# Patient Record
Sex: Female | Born: 1955 | Race: White | Hispanic: No | Marital: Married | Smoking: Never smoker
Health system: Southern US, Community
[De-identification: ages and names within clinical notes are randomized; demographics above are authoritative.]

## PROBLEM LIST (undated history)

## (undated) DIAGNOSIS — F418 Other specified anxiety disorders: Secondary | ICD-10-CM

## (undated) DIAGNOSIS — I341 Nonrheumatic mitral (valve) prolapse: Secondary | ICD-10-CM

## (undated) DIAGNOSIS — I10 Essential (primary) hypertension: Secondary | ICD-10-CM

## (undated) DIAGNOSIS — I2510 Atherosclerotic heart disease of native coronary artery without angina pectoris: Secondary | ICD-10-CM

## (undated) DIAGNOSIS — I214 Non-ST elevation (NSTEMI) myocardial infarction: Secondary | ICD-10-CM

## (undated) HISTORY — PX: OTHER SURGICAL HISTORY: SHX169

## (undated) HISTORY — DX: Other specified anxiety disorders: F41.8

## (undated) HISTORY — DX: Essential (primary) hypertension: I10

## (undated) HISTORY — DX: Non-ST elevation (NSTEMI) myocardial infarction: I21.4

## (undated) HISTORY — DX: Atherosclerotic heart disease of native coronary artery without angina pectoris: I25.10

## (undated) HISTORY — DX: Nonrheumatic mitral (valve) prolapse: I34.1

---

## 1993-02-25 DIAGNOSIS — I341 Nonrheumatic mitral (valve) prolapse: Secondary | ICD-10-CM

## 1993-02-25 HISTORY — DX: Nonrheumatic mitral (valve) prolapse: I34.1

## 2005-01-29 ENCOUNTER — Ambulatory Visit (HOSPITAL_COMMUNITY): Admission: RE | Admit: 2005-01-29 | Discharge: 2005-01-29 | Payer: Self-pay | Admitting: Family Medicine

## 2005-09-17 ENCOUNTER — Ambulatory Visit (HOSPITAL_COMMUNITY): Admission: RE | Admit: 2005-09-17 | Discharge: 2005-09-17 | Payer: Self-pay | Admitting: Family Medicine

## 2006-03-31 ENCOUNTER — Ambulatory Visit (HOSPITAL_COMMUNITY): Admission: RE | Admit: 2006-03-31 | Discharge: 2006-03-31 | Payer: Self-pay | Admitting: Family Medicine

## 2007-04-20 ENCOUNTER — Ambulatory Visit (HOSPITAL_COMMUNITY): Admission: RE | Admit: 2007-04-20 | Discharge: 2007-04-20 | Payer: Self-pay | Admitting: Family Medicine

## 2007-05-04 ENCOUNTER — Ambulatory Visit (HOSPITAL_COMMUNITY): Admission: RE | Admit: 2007-05-04 | Discharge: 2007-05-04 | Payer: Self-pay | Admitting: Family Medicine

## 2007-09-14 ENCOUNTER — Ambulatory Visit (HOSPITAL_COMMUNITY): Admission: RE | Admit: 2007-09-14 | Discharge: 2007-09-14 | Payer: Self-pay | Admitting: Family Medicine

## 2007-10-23 ENCOUNTER — Emergency Department (HOSPITAL_COMMUNITY): Admission: EM | Admit: 2007-10-23 | Discharge: 2007-10-23 | Payer: Self-pay | Admitting: Emergency Medicine

## 2007-11-09 ENCOUNTER — Ambulatory Visit (HOSPITAL_COMMUNITY): Admission: RE | Admit: 2007-11-09 | Discharge: 2007-11-09 | Payer: Self-pay | Admitting: Family Medicine

## 2008-05-19 ENCOUNTER — Ambulatory Visit (HOSPITAL_COMMUNITY): Admission: RE | Admit: 2008-05-19 | Discharge: 2008-05-19 | Payer: Self-pay | Admitting: Family Medicine

## 2009-04-05 ENCOUNTER — Ambulatory Visit (HOSPITAL_COMMUNITY): Payer: Self-pay | Admitting: Oncology

## 2009-04-05 ENCOUNTER — Encounter (HOSPITAL_COMMUNITY): Admission: RE | Admit: 2009-04-05 | Discharge: 2009-05-05 | Payer: Self-pay | Admitting: Oncology

## 2009-07-05 ENCOUNTER — Ambulatory Visit (HOSPITAL_COMMUNITY): Admission: RE | Admit: 2009-07-05 | Discharge: 2009-07-05 | Payer: Self-pay | Admitting: Family Medicine

## 2009-07-10 ENCOUNTER — Encounter (HOSPITAL_COMMUNITY): Admission: RE | Admit: 2009-07-10 | Discharge: 2009-08-09 | Payer: Self-pay | Admitting: Oncology

## 2009-07-31 ENCOUNTER — Ambulatory Visit (HOSPITAL_COMMUNITY): Payer: Self-pay | Admitting: Oncology

## 2010-03-17 ENCOUNTER — Encounter: Payer: Self-pay | Admitting: Family Medicine

## 2010-03-18 ENCOUNTER — Encounter: Payer: Self-pay | Admitting: Family Medicine

## 2010-04-08 ENCOUNTER — Encounter: Payer: Self-pay | Admitting: Cardiology

## 2010-04-08 ENCOUNTER — Inpatient Hospital Stay (HOSPITAL_COMMUNITY)
Admission: RE | Admit: 2010-04-08 | Discharge: 2010-04-13 | DRG: 282 | Disposition: A | Discharge status: Home / Self Care | Payer: 59 | Source: Other Acute Inpatient Hospital | Attending: Cardiology | Admitting: Cardiology

## 2010-04-08 DIAGNOSIS — F341 Dysthymic disorder: Secondary | ICD-10-CM | POA: Diagnosis present

## 2010-04-08 DIAGNOSIS — I214 Non-ST elevation (NSTEMI) myocardial infarction: Secondary | ICD-10-CM

## 2010-04-08 DIAGNOSIS — I2510 Atherosclerotic heart disease of native coronary artery without angina pectoris: Secondary | ICD-10-CM | POA: Diagnosis present

## 2010-04-08 DIAGNOSIS — I2109 ST elevation (STEMI) myocardial infarction involving other coronary artery of anterior wall: Principal | ICD-10-CM | POA: Diagnosis present

## 2010-04-08 DIAGNOSIS — I200 Unstable angina: Secondary | ICD-10-CM

## 2010-04-08 DIAGNOSIS — E785 Hyperlipidemia, unspecified: Secondary | ICD-10-CM | POA: Diagnosis present

## 2010-04-08 DIAGNOSIS — I10 Essential (primary) hypertension: Secondary | ICD-10-CM | POA: Diagnosis present

## 2010-04-08 DIAGNOSIS — Z79899 Other long term (current) drug therapy: Secondary | ICD-10-CM

## 2010-04-08 DIAGNOSIS — Z7982 Long term (current) use of aspirin: Secondary | ICD-10-CM

## 2010-04-08 LAB — HEPARIN LEVEL (UNFRACTIONATED): Heparin Unfractionated: 0.16 IU/mL — ABNORMAL LOW (ref 0.30–0.70)

## 2010-04-08 LAB — CARDIAC PANEL(CRET KIN+CKTOT+MB+TROPI)
CK, MB: 47 ng/mL (ref 0.3–4.0)
Relative Index: INVALID (ref 0.0–2.5)
Relative Index: 13 — ABNORMAL HIGH (ref 0.0–2.5)
Total CK: 362 U/L — ABNORMAL HIGH (ref 7–177)
Troponin I: 0.57 ng/mL (ref 0.00–0.06)
Troponin I: 3.2 ng/mL (ref 0.00–0.06)

## 2010-04-08 LAB — CBC
Hemoglobin: 11.3 g/dL — ABNORMAL LOW (ref 12.0–15.0)
MCH: 30.5 pg (ref 26.0–34.0)
RBC: 3.71 MIL/uL — ABNORMAL LOW (ref 3.87–5.11)
WBC: 5.7 10*3/uL (ref 4.0–10.5)

## 2010-04-08 LAB — PROTIME-INR
INR: 1.02 (ref 0.00–1.49)
Prothrombin Time: 13.6 seconds (ref 11.6–15.2)

## 2010-04-08 LAB — DIFFERENTIAL
Basophils Relative: 0 % (ref 0–1)
Monocytes Relative: 7 % (ref 3–12)
Neutro Abs: 4.2 10*3/uL (ref 1.7–7.7)
Neutrophils Relative %: 73 % (ref 43–77)

## 2010-04-08 LAB — MRSA PCR SCREENING: MRSA by PCR: NEGATIVE

## 2010-04-08 NOTE — H&P (Signed)
[name], Harrison NO.:  [id number]  Harrison RECORD NO.:  [id number]           PATIENT TYPE:  I  LOCATION:  2903                         FACILITY:  [facility]  PHYSICIAN:  Jonelle Sidle, MD DATE OF BIRTH:  June 09, 1955  DATE OF ADMISSION:  04/08/2010 DATE OF DISCHARGE:                             HISTORY & PHYSICAL   PRIMARY CARE:  [place]rika [place]  in [place], [place].  REFERRING PHYSICIAN:  Dr. Wende Harrison with the [facility] Team.  REASON FOR ADMISSION:  Acute coronary syndrome, non-ST elevation myocardial infarction.  HISTORY OF PRESENT ILLNESS:  [name] is a pleasant 55 year old woman with a history of hypertension, remote borderline diabetes mellitus that reportedly resolved with significant weight loss and diet, and no clear history of hyperlipidemia or cardiovascular disease.  Erika Harrison indicates that Erika Harrison has been in Erika Harrison usual state of health, exercising regularly by walking and using a stationary ski machine without symptoms.  Yesterday evening after taking Erika Harrison medicines and lying down, Erika Harrison began to suddenly experience an intense burning sensation with pressure in Erika [place] upper chest, ultimately radiating to Erika Harrison arms, and associated with nausea after several minutes.  Erika Harrison had never experienced any similar symptoms, became concerned, and went to the [facility] Emergency Department.  Erika Harrison was treated with aspirin, nitroglycerin, and heparin with improvement in symptoms, subsequently admitted for further observation.  Erika [place] initial cardiac markers were normal, however, troponin-I trended up to 1.58, and then down to 0.93. Initial ECG showed sinus rhythm with poor R-wave progression in the septal leads, otherwise nonspecific ST changes, question hyperacute T- waves in leads V3-V6.  Subsequent tracing today demonstrates sinus rhythm with evolutionary changes in the ST-T waves throughout the precordium suggestive  of Wellens T-waves.  Erika Harrison remains hemodynamically and symptomatically stable and was transferred here to the intensive care unit for further management.  ALLERGIES:  No known drug allergies.  MEDICATIONS AT HOME:  Include, 1. Paxil 40 mg p.o. daily. 2. Aspirin 81 mg p.o. daily. 3. Hydrochlorothiazide 25 mg p.o daily. 4. Clonazepam 0.5 mg p.o. b.i.d. 5. Captopril 12.5 mg p.o. b.i.d.  PAST Harrison HISTORY:  Includes hypertension, equivocal history of mitral valve prolapse that was reportedly assessed back in 1995, at which time the patient reports a normal stress test.  Previous surgeries include right breast and laparoscopic abdominal surgery.  Also history of anxiety and depression.  States that Erika Harrison was seen by Dr. Glenford Harrison several months ago related to "low white blood cell count", details not entirely clear.  SOCIAL HISTORY:  The patient is married, lives in [place], [place].  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  Denies any history of tobacco or alcohol use.  No illicit substance use.  FAMILY HISTORY:  Reviewed.  The patient's father is living in his 71s with a history of renal cell carcinoma and also diabetes mellitus. Mother is living in [place] 87s, reportedly healthy.  The patient has 3 siblings, a sister with multiple sclerosis and hypertension, a brother with history of drug addiction and hypertension, and another brother with history of rheumatoid arthritis.  There is no clearly documented history of premature cardiovascular disease noted.  REVIEW OF SYSTEMS:  As detailed above.  No reported bleeding diathesis. No melena or hematochezia.  Does have an intermittent history of palpitations, although no dizziness or syncope.  No reported cardiac dysrhythmias.  Stable appetite.  The patient states that Erika Harrison lost 80 pounds intentionally back in 2003 with subsequent significant improvement in Erika Harrison glucose.  No  orthopnea or PND.  No claudication. Otherwise, reviewed negative.  PHYSICAL EXAMINATION:  VITAL SIGNS:  Heart rate is 68 in sinus rhythm, saturations 100% on room air, blood pressure is 106/70, respirations 18 and nonlabored. GENERAL:  This is a normally nourished-appearing woman, in no acute distress. HEENT:  Conjunctivae and lids are normal.  Oropharynx is clear. NECK:  Supple.  No elevated JVP or loud bruits.  No thyromegaly is noted. LUNGS:  Clear without labored breathing at rest. CARDIAC:  Regular rate and rhythm.  No midsystolic click or S3 gallop. No pericardial rub.  No significant systolic murmur. ABDOMEN:  Soft and nontender.  Bowel sounds present. EXTREMITIES:  No significant pitting edema.  Distal pulses are full. SKIN:  Warm and dry. MUSCULOSKELETAL:  No kyphosis is noted. NEUROPSYCHIATRIC:  The patient is alert and oriented x3.  Affect is appropriate.  LABORATORY DATA:  From [facility]; glucose 96, BUN 23, creatinine 0.8, sodium 138, potassium 3.1, chloride 101, bicarb 30, INR 0.9.  WBC 6.1, hemoglobin 13.1, hematocrit 39.5, platelets 258.  Peak troponin-I of 1.58 with CK-MB relative index of 8.9.  Chest x-ray report from [facility] on April 07, 2010, indicates normal cardiomediastinal silhouette with no evidence of consolidation or effusions.  IMPRESSION: 1. Presentation with non-ST-elevation myocardial infarction, symptom     onset last evening, presently improved on combination of heparin     and nitroglycerin.  The patient was also given a Plavix load of 600     mg at [facility], also continued on aspirin.  Erika Harrison     is now transferred to [facility] for further management     and anticipation of a diagnostic cardiac catheterization with eye     toward revascularization strategies.  Erika Harrison is hemodynamically stable     at present. 2. History of hypertension, blood pressure increased at presentation,      now well controlled. 3. Uncertain lipid status, but reportedly reasonably well controlled     and not on any long-term statin medications. 4. Reported history of "borderline diabetes" back in 2003, managed     with significant weight loss and diet and without formal diagnosis     of diabetes mellitus at this point. 5. History of anxiety and depression, on Paxil and clonazepam as an     outpatient. 6. Reported history of intermittent longstanding palpitations, no     obvious history of dysrhythmia. 7. Hypokalemia, on hydrochlorothiazide as an outpatient, no potassium     supplementation.  PLAN:  The patient is now admitted to the intensive care unit.  We will continue aspirin, Plavix, heparin, nitroglycerin, and initiate very low- dose Lopressor as tolerated as well as Crestor.  Captopril will be continued, although at lowered dose for now.  Hold hydrochlorothiazide and replete potassium.  Obtain additional sets of cardiac markers as well  as a hemoglobin A1c and a fasting lipid profile for the morning.  The patient  is scheduled for a diagnostic cardiac catheterization tomorrow with an eye  toward revascularization options, sooner if Erika Harrison becomes clinically unstable.  Risks and benefits were discussed.  Erika Harrison is in agreement to proceed.     Jonelle Sidle, MD     SGM/MEDQ  D:  04/08/2010  T:  04/08/2010  Job:  [id number]  cc:   Dr. Wende Harrison  Electronically Signed by Nona Dell MD on 04/08/2010 01:40:59 PM

## 2010-04-09 DIAGNOSIS — I219 Acute myocardial infarction, unspecified: Secondary | ICD-10-CM

## 2010-04-09 LAB — CBC
Platelets: 178 10*3/uL (ref 150–400)
RBC: 3.55 MIL/uL — ABNORMAL LOW (ref 3.87–5.11)
RDW: 11.8 % (ref 11.5–15.5)
WBC: 5.8 10*3/uL (ref 4.0–10.5)

## 2010-04-09 LAB — LIPID PANEL
HDL: 56 mg/dL (ref >39)
LDL Cholesterol: 54 mg/dL (ref 0–99)
Triglycerides: 52 mg/dL (ref <150)

## 2010-04-09 LAB — GLUCOSE, CAPILLARY
Glucose-Capillary: 105 mg/dL — ABNORMAL HIGH (ref 70–99)
Glucose-Capillary: 107 mg/dL — ABNORMAL HIGH (ref 70–99)
Glucose-Capillary: 133 mg/dL — ABNORMAL HIGH (ref 70–99)

## 2010-04-09 LAB — PROTIME-INR: Prothrombin Time: 12.7 seconds (ref 11.6–15.2)

## 2010-04-09 LAB — CK TOTAL AND CKMB (NOT AT ARMC): Total CK: 545 U/L — ABNORMAL HIGH (ref 7–177)

## 2010-04-09 LAB — HEPARIN LEVEL (UNFRACTIONATED): Heparin Unfractionated: 0.23 IU/mL — ABNORMAL LOW (ref 0.30–0.70)

## 2010-04-09 LAB — BASIC METABOLIC PANEL
CO2: 29 mEq/L (ref 19–32)
Calcium: 8.9 mg/dL (ref 8.4–10.5)
GFR calc Af Amer: >60 mL/min (ref >60)
GFR calc non Af Amer: >60 mL/min (ref >60)
Potassium: 3.7 mEq/L (ref 3.5–5.1)
Sodium: 141 mEq/L (ref 135–145)

## 2010-04-09 LAB — CARDIAC PANEL(CRET KIN+CKTOT+MB+TROPI): Relative Index: 18.1 — ABNORMAL HIGH (ref 0.0–2.5)

## 2010-04-10 LAB — BASIC METABOLIC PANEL
Calcium: 8.6 mg/dL (ref 8.4–10.5)
GFR calc Af Amer: >60 mL/min (ref >60)
GFR calc non Af Amer: >60 mL/min (ref >60)
Glucose, Bld: 92 mg/dL (ref 70–99)
Sodium: 142 mEq/L (ref 135–145)

## 2010-04-10 LAB — HEPARIN LEVEL (UNFRACTIONATED): Heparin Unfractionated: 0.48 IU/mL (ref 0.30–0.70)

## 2010-04-10 LAB — CBC
MCHC: 33.3 g/dL (ref 30.0–36.0)
Platelets: 149 10*3/uL — ABNORMAL LOW (ref 150–400)
RDW: 12.1 % (ref 11.5–15.5)

## 2010-04-10 LAB — GLUCOSE, CAPILLARY: Glucose-Capillary: 101 mg/dL — ABNORMAL HIGH (ref 70–99)

## 2010-04-11 DIAGNOSIS — I2109 ST elevation (STEMI) myocardial infarction involving other coronary artery of anterior wall: Secondary | ICD-10-CM

## 2010-04-11 LAB — CBC
MCH: 30.6 pg (ref 26.0–34.0)
MCV: 91.6 fL (ref 78.0–100.0)
Platelets: 163 10*3/uL (ref 150–400)
RBC: 3.59 MIL/uL — ABNORMAL LOW (ref 3.87–5.11)

## 2010-04-11 NOTE — Procedures (Signed)
Erika Harrison, Erika Harrison           ACCOUNT NO.:  [id number]  MEDICAL RECORD NO.:  [id number]           PATIENT TYPE:  I  LOCATION:  2903                         FACILITY:  [facility]  PHYSICIAN:  Peter M. [place], M.D.  DATE OF BIRTH:  07/25/1955  DATE OF PROCEDURE:  04/08/2010 DATE OF DISCHARGE:                           CARDIAC CATHETERIZATION   INDICATIONS FOR PROCEDURE:  A 55 year old white female who presented with a non-ST-elevation myocardial infarction.  She has a history of hypertension.  After arrival to the hospital, the patient had episode of severe chest pain associated with ST elevation in the anterior septal leads.  Emergent cardiac catheterization was recommended.  At arrival in the cath lab, she was painfree and her ST elevation had resolved.  PROCEDURES: 1. Left heart catheterization. 2. Coronary and left ventricular angiography.  ACCESS:  Via the right femoral artery using standard Seldinger technique.  EQUIPMENTS:  6-French 4-cm right and left Judkins catheter, 6-French pigtail catheter, 6-French arterial sheath.  MEDICATIONS:  Local anesthesia with 1% Xylocaine, Versed 2 mg IV, nitroglycerin 200 mcg intracoronary x1.  CONTRAST:  90 mL of Omnipaque.  HEMODYNAMIC DATA:  Aortic pressure is 109/68 with a mean of 87 mmHg. Left ventricular pressure was 112 with EDP of 11 mmHg.  ANGIOGRAPHIC DATA:  The left coronary artery arises and distributes normally.  The left main coronary artery is normal.  The left anterior descending artery extends out to the apex.  The proximal segment is large and without significant disease.  After takeoff of two small diagonal branches, the LAD tapers significantly throughout the mid-to-distal vessel.  In the distal vessel prior to the apex, there is subtotaled occlusion of the vessel.  At this point, the vessel diameter appears to be less than 1.5 mm.  Left circumflex coronary artery is normal.  The right coronary artery is a  dominant vessel.  It is normal throughout.  No collaterals are seen to the LAD.  Left ventricular angiography performed in the RAO view demonstrates normal left ventricular chamber size.  There is apical akinesia. Overall ejection fraction is estimated at 50-55%.  At the end of the procedure, the patient's groin was closed with an Angio-Seal device with excellent hemostasis.  FINAL INTERPRETATION: 1. Single-vessel obstructive atherosclerotic coronary artery disease     involving the distal LAD.  There is an ulcerated plaque in the mid     LAD that is nonobstructive.  This is probably the cause of her     acute symptoms today.  She is currently painfree and ECG is back to     baseline. 2. Mild left ventricular dysfunction.  PLAN:  Would recommend aggressive medical therapy.  The distal LAD occlusion appears to be too small to warrant intervention with balloon angioplasty.  It is clearly too small to stent.  The ulcerated plaque in the mid vessel is nonobstructive at this time and would recommend aggressive medical therapy.  If she has refractory symptoms with recurrent ST elevation, she would be a candidate for stenting of the mid LAD.  To mend the portion of the LAD, I will report with the addition saying that there does appear  to be an ulcerated plaque in the mid-LAD with some hang-up of contrast.  This is very focal and is clearly nonobstructive at this time.          ______________________________ Peter M. [place], M.D.     PMJ/MEDQ  D:  04/08/2010  T:  04/09/2010  Job:  [id number]  cc:   Fayrene Fearing [facility] Jonelle Sidle, MD  Electronically Signed by PETER [place]and M.D. on 04/11/2010 11:20:45 AM

## 2010-04-12 LAB — CBC
HCT: 33.1 % — ABNORMAL LOW (ref 36.0–46.0)
Hemoglobin: 11.4 g/dL — ABNORMAL LOW (ref 12.0–15.0)
MCH: 30.8 pg (ref 26.0–34.0)
MCHC: 34.4 g/dL (ref 30.0–36.0)

## 2010-04-13 LAB — BASIC METABOLIC PANEL
BUN: 15 mg/dL (ref 6–23)
Calcium: 9.1 mg/dL (ref 8.4–10.5)
Creatinine, Ser: 0.69 mg/dL (ref 0.4–1.2)
GFR calc non Af Amer: >60 mL/min (ref >60)

## 2010-04-13 LAB — CBC
MCH: 30.9 pg (ref 26.0–34.0)
MCV: 89.5 fL (ref 78.0–100.0)
Platelets: 182 10*3/uL (ref 150–400)
RDW: 11.9 % (ref 11.5–15.5)
WBC: 4.2 10*3/uL (ref 4.0–10.5)

## 2010-04-23 ENCOUNTER — Emergency Department (HOSPITAL_COMMUNITY)
Admission: EM | Admit: 2010-04-23 | Discharge: 2010-04-23 | Disposition: A | Discharge status: Home / Self Care | Payer: Self-pay | Attending: Emergency Medicine | Admitting: Emergency Medicine

## 2010-04-23 DIAGNOSIS — R1031 Right lower quadrant pain: Secondary | ICD-10-CM | POA: Insufficient documentation

## 2010-04-23 DIAGNOSIS — Z9889 Other specified postprocedural states: Secondary | ICD-10-CM | POA: Insufficient documentation

## 2010-04-23 DIAGNOSIS — I10 Essential (primary) hypertension: Secondary | ICD-10-CM | POA: Insufficient documentation

## 2010-04-23 DIAGNOSIS — Z79899 Other long term (current) drug therapy: Secondary | ICD-10-CM | POA: Insufficient documentation

## 2010-04-23 DIAGNOSIS — R42 Dizziness and giddiness: Secondary | ICD-10-CM | POA: Insufficient documentation

## 2010-04-23 DIAGNOSIS — E7800 Pure hypercholesterolemia, unspecified: Secondary | ICD-10-CM | POA: Insufficient documentation

## 2010-04-23 LAB — BASIC METABOLIC PANEL
BUN: 20 mg/dL (ref 6–23)
Chloride: 105 mEq/L (ref 96–112)
Creatinine, Ser: 0.72 mg/dL (ref 0.4–1.2)
GFR calc non Af Amer: >60 mL/min (ref >60)
Glucose, Bld: 98 mg/dL (ref 70–99)

## 2010-04-23 LAB — CBC
MCH: 31.3 pg (ref 26.0–34.0)
MCV: 89.9 fL (ref 78.0–100.0)
Platelets: 236 10*3/uL (ref 150–400)
RDW: 11.9 % (ref 11.5–15.5)

## 2010-04-23 LAB — POCT CARDIAC MARKERS

## 2010-04-23 LAB — DIFFERENTIAL
Eosinophils Absolute: 0.1 10*3/uL (ref 0.0–0.7)
Eosinophils Relative: 2 % (ref 0–5)
Lymphs Abs: 1.2 10*3/uL (ref 0.7–4.0)
Monocytes Relative: 13 % — ABNORMAL HIGH (ref 3–12)

## 2010-04-24 NOTE — Cardiovascular Report (Signed)
Summary: [facility]: Cardiac Cath  [facility]: Cardiac Cath   Imported By: Earl Many 04/19/2010 13:42:25  _____________________________________________________________________  External Attachment:    Type:   Image     Comment:   External Document

## 2010-04-25 NOTE — Procedures (Signed)
  [name], Harrison NO.:  [id number]  MEDICAL RECORD NO.:  [id number]           PATIENT TYPE:  I  LOCATION:  4735                         FACILITY:  [facility]  PHYSICIAN:  Tzivia Oneil M. [place], M.D.  DATE OF BIRTH:  February 01, 1956  DATE OF PROCEDURE:  04/12/2010 DATE OF DISCHARGE:                           CARDIAC CATHETERIZATION   INDICATIONS FOR PROCEDURE:  The patient is a 55 year old white female, who presented on April 08, 2010 with acute ST elevation anterior myocardial infarction.  She was found at that time to have an ulcerated plaque in the midvessel and occlusion of the distal LAD.  There were some concern for internal  hematoma.  The patient was brought back to the lab to evaluate the healing of the vessel and whether this hematoma had extended.  PROCEDURE:  Coronary angiography.  ACCESS:  Via the right femoral artery using standard Seldinger technique.  EQUIPMENTS:  5-French 4-cm right and left Judkins catheter, 5-French arterial sheath.  CONTRAST:  50 mL of Omnipaque.  MEDICATIONS:  Local anesthesia, 1% Xylocaine, Versed 2 mg IV.  HEMODYNAMIC DATA:  Aortic pressure is 122/74 with a mean of 94 mmHg.  Coronary angiography demonstrates left main coronary artery is normal.  The left anterior descending artery has mild diffuse narrowing of the entire distal LAD, but there is excellent TIMI grade 3 flow throughout including the prior occlusion in the distal LAD.  There was no evidence of ulceration or dye hang-up in the mid vessel that was noted previously.  The left circumflex coronary artery is normal.  The right coronary artery is normal.  FINAL INTERPRETATION:  Nonobstructive coronary artery disease.  The previous ulceration in the mid-LAD has healed.  There is reperfusion of the distal LAD with excellent TIMI grade 3 flow.  PLAN:  Recommend continued medical therapy.          ______________________________ Erika Harrison M. [place],  M.D.     PMJ/MEDQ  D:  04/12/2010  T:  04/12/2010  Job:  [id number]  cc:   [facility] Jonelle Sidle, MD  Electronically Signed by Tashaun Obey [place] M.D. on 04/25/2010 04:50:31 PM

## 2010-04-25 NOTE — Discharge Summary (Signed)
NAMEGEORGEANA, Erika Harrison           ACCOUNT NO.:  [id number]  MEDICAL RECORD NO.:  [id number]           PATIENT TYPE:  I  LOCATION:  4735                         FACILITY:  [facility]  PHYSICIAN:  Elka Satterfield M. [place], M.D.  DATE OF BIRTH:  05-27-1955  DATE OF ADMISSION:  04/08/2010 DATE OF DISCHARGE:  04/13/2010                              DISCHARGE SUMMARY   PRIMARY CARDIOLOGIST:  (New) Dr. Simona Huh.  INTERVENTIONAL CARDIOLOGIST:  Dr. Aaban Griep [place].  PRIMARY CARE PHYSICIAN:  [facility], [place], [place].  DISCHARGE DIAGNOSES: 1. Non-ST-elevation myocardial infarction.     a.     Cardiac catheterization, April 09, 2010:  Single vessel      obstructive atherosclerotic coronary artery disease involving the      distal left anterior descending artery.  Ulcerated plaque in the      mid LAD, nonobstructive - probable cause of her acute symptoms      today.  Currently pain-free with ECG back to baseline.  Mild LV      dysfunction (apical akinesis, but LVEF normal at 50%-55%).      Medical management pursued.     b.     2-D echocardiogram, April 09, 2010:  LV cavity size      normal, wall thickness normal, LVEF mildly reduced 45%-50%,      akinesis of the apical myocardium, grade 1 diastolic dysfunction.     c.     Relook cardiac catheterization, April 12, 2010:      Nonobstructive coronary artery disease.  A previous ulceration in      the mid-LAD has healed.  There is a reperfusion of the distal LAD      with excellent TIMI grade 3 flow.  SECONDARY DIAGNOSES: 1. Hypertension. 2. Depression/anxiety.  PAST SURGICAL HISTORY: 1. Status post laparoscopic abdominal surgery. 2. Surgery to right breast.  ALLERGIES:  NKDA.  PROCEDURES: 1. EKG, April 08, 2010:  NSR, poor R-wave progression in the septal     leads, nonspecific ST changes, question of hyperacute T-wave in V3-     V6. 2. EKG, April 08, 2010.  NSR, evolving changes in the ST-T wave  throughout the precordium suggesting Wellens syndrome. 3. Cardiac catheterization, April 09, 2010:  LM normal.  LAD,     proximal segment is large without significant disease, after     takeoff of two small diagonal branches LAD tapers significantly     throughout the mid-to-distal vessel, prior to apex there is     subtotal occlusion of the vessel, diameter appears less than 1.5     mm.  Left circumflex normal.  Right coronary, dominant, normal.  No     collateral seen to LAD.  Apical akinesis with LVEF of 50%-55%. 4. 2-D echocardiogram, April 09, 2010:  Please see discharge     diagnoses section #1, subsection b. 5. Relook cardiac catheterization, April 12, 2010:  Please see     discharge diagnoses section #1, subsection c.  HISTORY OF PRESENT ILLNESS:  [name] is a 55 year old Caucasian female, who presented with a non-ST-elevation myocardial infarction noted after a severe chest discomfort at [facility].  She was evaluated by attending cardiologist, Dr. Simona Huh, and due to concerning EKG changes was sent emergently to cardiac cath lab at [facility].  The patient was loaded with 600 mg of Plavix prior to transfer to [facility] cath lab.  HOSPITAL COURSE:  The patient was admitted and underwent procedures as described above.  Of note, there was no amenable sites for intervention on her initial cardiac catheterization and so initial plan was for aggressive medical management.  A 2-D echocardiogram was completed the following day that showed mild LV dysfunction and her meds were adjusted accordingly (see discharge medications).  The patient worked with Cardiac Rehab on April 09, 2010, receiving education on that day only.  Films were reviewed from initial catheterization by attending cardiologist/interventionalist Dr. Shawnie Pons and discussed with attending cardiologist Dr. Marca Ancona.  The patient was thought to possibly had coronary dissection and  therefore scheduled for relook cardiac cath on April 12, 2010.  Luckily, that showed healing of her previous ulcerated plaque in LAD and no other significant disease with reperfusion of the entire LAD.  She was kept overnight and seen in the morning by attending cardiologist/interventionalist Dr. Lutricia Widjaja [place]. She had no complications and was deemed stable for discharge with followup and meds as outlined in their respective sections.  The patient did receive her new medication list, prescriptions, follow-up instructions, and post-cath instructions as well as having all questions and concerns addressed prior to leaving the hospital.  Please note, secondary to financial constraints, the patient received a case management consult for assistant with her medications.  DISCHARGE LABS:  WBC is 4.2, HGB 12.1, HCT 35.1, PLT count is 182. Sodium 140, potassium 3.9, chloride 106, bicarb 29, BUN is 15, creatinine 0.69, glucose 94.  Hemoglobin A1c 5.3%.  Initial full set cardiac enzymes, CK 89, MB 7.5, troponin 0.57.  Second full set, CK 362, MB 47.0, troponin 3.20.  Third full set, CK 654, MB 118.3, troponin 7.06.  Fourth full set, CK 545, MB 93.7, troponin-I 6.22.  Total cholesterol 120, triglycerides 52, HDL 56, LDL 54, total cholesterol/HDL ratio 2.1.  FOLLOWUP PLANS AND APPOINTMENTS:  Dr. Simona Huh at [facility], [place] office, April 26, 2010 at 1:15 p.m.  DISCHARGE MEDICATIONS: 1. Enteric-coated aspirin 81 mg p.o. daily. 2. Carvedilol 3.125 mg p.o. b.i.d. with meals. 3. Clopidogrel 75 mg 1 tablet daily with meals. 4. Nitroglycerin 0.4 mg sublingual q.5 minutes up to 3 doses p.r.n.     for chest discomfort. 5. Pravastatin 40 mg 1 tablet p.o. nightly. 6. Calcium over-the-counter 1 tablet daily. 7. Captopril 25 mg 1 tablet p.o. b.i.d. 8. Clonazepam 1 mg p.o. b.i.d. 9. Fiber over-the-counter 1 tablet p.o. b.i.d. 10.Paxil 40 mg 1 tablet p.o. nightly.  DURATION OF DISCHARGE  ENCOUNTER:  Including physician time was 40 minutes.     Jarrett Ables, PAC   ______________________________ [name], M.D.    MS/MEDQ  D:  04/13/2010  T:  04/13/2010  Job:  [id number]  cc:   Jonelle Sidle, MD [facility]  Electronically Signed by Jarrett Ables [facility] on 04/17/2010 01:03:22 PM Electronically Signed by Lutie Pickler [place] M.D. on 04/25/2010 04:50:28 PM

## 2010-04-26 ENCOUNTER — Encounter (INDEPENDENT_AMBULATORY_CARE_PROVIDER_SITE_OTHER): Payer: Self-pay | Admitting: *Deleted

## 2010-04-26 ENCOUNTER — Ambulatory Visit (INDEPENDENT_AMBULATORY_CARE_PROVIDER_SITE_OTHER): Payer: Self-pay | Admitting: Adult Health

## 2010-04-26 ENCOUNTER — Encounter: Payer: Self-pay | Admitting: Adult Health

## 2010-04-26 DIAGNOSIS — E782 Mixed hyperlipidemia: Secondary | ICD-10-CM | POA: Insufficient documentation

## 2010-04-26 DIAGNOSIS — I10 Essential (primary) hypertension: Secondary | ICD-10-CM

## 2010-04-26 DIAGNOSIS — I2510 Atherosclerotic heart disease of native coronary artery without angina pectoris: Secondary | ICD-10-CM

## 2010-04-27 ENCOUNTER — Encounter: Payer: Self-pay | Admitting: Adult Health

## 2010-04-30 ENCOUNTER — Encounter: Payer: Self-pay | Admitting: Adult Health

## 2010-04-30 LAB — CONVERTED CEMR LAB
AST: 19 units/L (ref 0–37)
Albumin: 4.7 g/dL (ref 3.5–5.2)
Alkaline Phosphatase: 61 units/L (ref 39–117)
HDL: 54 mg/dL (ref >39)
LDL Cholesterol: 66 mg/dL (ref 0–99)
Total Bilirubin: 1.3 mg/dL — ABNORMAL HIGH (ref 0.3–1.2)
Total CHOL/HDL Ratio: 2.5
Total Protein: 6.9 g/dL (ref 6.0–8.3)
Triglycerides: 67 mg/dL (ref <150)

## 2010-05-02 ENCOUNTER — Telehealth (INDEPENDENT_AMBULATORY_CARE_PROVIDER_SITE_OTHER): Payer: Self-pay | Admitting: *Deleted

## 2010-05-03 NOTE — Assessment & Plan Note (Signed)
Summary: POST HOSP [place]   Visit Type:  Follow-up Hospitalization Primary Provider:  [facility]   History of Present Illness: Erika Harrison is a pleasant yet anxious  55 y/o CF we are seeing on hospital follow-up after admission to [facility] hospital with NSTEMI in the setting of ulcerated plaque in the mid LAD, nonobstructive.  Catherization was completed by Dr. Sw[place]and on 04/09/2010.  She actually had a relook catherization on 04/12/2010 with complete healing of the LAD ulceration and to r/o coronary dissection.  There was not found to have any other significant disease elsewhere. She had mild apical hypokinesis with LVEF of 50-55%.  She was placed on plavix (drug assistance in place) statin, ASA, and carvedilol.  She has tolerated the medication and has been without complaint of recurring pain.  She has ntg tablets in a heart shaped locket she wears around her neck. She has multiple questions.   Current Medications (verified): 1)  Paxil 40 Mg Tabs (Paroxetine Hcl) .... Take 1 Tab Daily 2)  Aspir-Low 81 Mg Tbec (Aspirin) .... Take 1 Tab Daily 3)  Clonazepam 1 Mg Tabs (Clonazepam) .... 1/2 -1 Tablet Daily 4)  Carvedilol 3.125 Mg Tabs (Carvedilol) .... Take One Tablet By Mouth Twice A Day 5)  Plavix 75 Mg Tabs (Clopidogrel Bisulfate) .... Take 1 Tablet By Mouth Once A Day 6)  Pravastatin Sodium 40 Mg Tabs (Pravastatin Sodium) .... Take One Tablet By Mouth Daily At Bedtime 7)  Calcarb 600 1500 Mg Tabs (Calcium Carbonate) .... Take 1 Tablet By Mouth Once A Day 8)  Fiber Laxative 625 Mg Tabs (Calcium Polycarbophil) .... Take 1 Tablet By Mouth Once A Day  Allergies (verified): No Known Drug Allergies  Past History:  Past medical, surgical, family and social histories (including risk factors) reviewed, and no changes noted (except as noted below).  Past Medical History: Reviewed history from 04/25/2010 and no changes required. hypertension depression/anxiety non-st-elevation  myocardial infarction nonobstructive coronary artery disease mitral valve prolapse 1995  Past Surgical History: Reviewed history from 04/25/2010 and no changes required. post laparscopic abdominal surgery right breast surgery  Family History: Reviewed history from 04/25/2010 and no changes required. Father:alive hx of renal cell carcinoma Mother:alive and well Siblings:2 brothers 1 rheumatoid arthritis,1 addicted to drugs 1 sister hypertension  Social History: Reviewed history from 04/25/2010 and no changes required. Married  Tobacco Use - No.  Alcohol Use - no Regular Exercise - no Drug Use - no  Review of Systems       All other systems have been reviewed and are negative unless stated above.   Vital Signs:  Patient profile:   55 year old female Height:      63 inches Weight:      135 pounds BMI:     24.00 O2 Sat:      98 % on Room air Pulse rate:   68 / minute BP sitting:   114 / 75  (left arm)  Vitals Entered By: Dreama Saa, CNA (April 26, 2010 1:13 PM)  O2 Flow:  Room air  Physical Exam  General:  Well developed, well nourished, in no acute distress.healthy appearing.   Lungs:  Clear bilaterally to auscultation and percussion. Heart:  Non-displaced PMI, chest non-tender; regular rate and rhythm, S1, S2 without murmurs, rubs or gallops. Carotid upstroke normal, no bruit. Normal abdominal aortic size, no bruits. Femorals normal pulses, no bruits. Pedals normal pulses. No edema, no varicosities. Abdomen:  Bowel sounds positive; abdomen soft and non-tender without  masses, organomegaly, or hernias noted. No hepatosplenomegaly. Msk:  Back normal, normal gait. Muscle strength and tone normal. Pulses:  pulses normal in all 4 extremities Extremities:  Right groin intact without bleeding or hematoma Neurologic:  Alert and oriented x 3. Skin:  Intact without lesions or rashes. Psych:  Normal affect.   Impression & Recommendations:  Problem # 1:  CAD  (ICD-414.00) Erika Harrison is stable at present. She has been without complaints of chest pain. She is overly aware of any discomfort that may occur.  She has ntg with her at all times.  All questions were answered,diagrams were used, and she verbalized understanding.  She will continue current medications as directed.  We will see her in 3 months for follow-up. Lipids and LFT's in 6 weeks. Her updated medication list for this problem includes:    Aspir-low 81 Mg Tbec (Aspirin) .Marland Kitchen... Take 1 tab daily    Carvedilol 3.125 Mg Tabs (Carvedilol) .Marland Kitchen... Take one tablet by mouth twice a day    Plavix 75 Mg Tabs (Clopidogrel bisulfate) .Marland Kitchen... Take 1 tablet by mouth once a day  Problem # 2:  HYPERCHOLESTEROLEMIA (ICD-272.0) She will be followed with ongoing labs as above. Her updated medication list for this problem includes:    Pravastatin Sodium 40 Mg Tabs (Pravastatin sodium) .Marland Kitchen... Take one tablet by mouth daily at bedtime  Other Orders: Future Orders: T-Lipid Profile ([id number]) ... 04/30/2010 T-Hepatic Function [phone]) ... 04/30/2010  Patient Instructions: 1)  Your physician recommends that you schedule a follow-up appointment in: 6 months 2)  Your physician recommends that you return for lab work GN:FAOZ week

## 2010-05-03 NOTE — Letter (Signed)
Summary: [place] [facility], [facility] at [employer]  [address] [place] [address]   Phone: [phone]  Fax: [phone]     April 26, 2010 MRN: [id number]   NELLA BOTSFORD [address] [place], [place]  [address]      YOUR LAB WORK IS DUE   MONDAY   April 30, 2010  Please go to [facility], located across the street from [facility] on the second floor.  Hours are Monday - Friday 7am until 7:30pm         Saturday 8am until 12noon    _X_  DO NOT EAT OR DRINK AFTER MIDNIGHT EVENING PRIOR TO LABWORK

## 2010-05-08 NOTE — Progress Notes (Signed)
  Phone Note Outgoing Call   Call placed by: Scherrie Bateman, LPN,  May 02, 2010 6:17 PM Call placed to: Patient Summary of Call: pt aware plavix 75 mg  received  from [facility]. will leave at front desk to pick up. Initial call taken by: Scherrie Bateman, LPN,  May 02, 2010 6:17 PM

## 2010-05-14 LAB — CBC
HCT: 37.4 % (ref 36.0–46.0)
Platelets: 224 10*3/uL (ref 150–400)
RDW: 12.3 % (ref 11.5–15.5)
WBC: 4.9 10*3/uL (ref 4.0–10.5)

## 2010-05-14 LAB — DIFFERENTIAL
Basophils Absolute: 0 10*3/uL (ref 0.0–0.1)
Eosinophils Relative: 1 % (ref 0–5)
Lymphocytes Relative: 30 % (ref 12–46)
Lymphs Abs: 1.5 10*3/uL (ref 0.7–4.0)
Neutro Abs: 3.1 10*3/uL (ref 1.7–7.7)

## 2010-05-16 LAB — CBC
HCT: 37 % (ref 36.0–46.0)
Hemoglobin: 12.7 g/dL (ref 12.0–15.0)
MCV: 93.5 fL (ref 78.0–100.0)
RBC: 3.96 MIL/uL (ref 3.87–5.11)
WBC: 5.2 10*3/uL (ref 4.0–10.5)

## 2010-05-16 LAB — DIFFERENTIAL
Eosinophils Absolute: 0.1 10*3/uL (ref 0.0–0.7)
Eosinophils Relative: 1 % (ref 0–5)
Lymphocytes Relative: 33 % (ref 12–46)
Lymphs Abs: 1.7 10*3/uL (ref 0.7–4.0)
Monocytes Absolute: 0.4 10*3/uL (ref 0.1–1.0)
Monocytes Relative: 8 % (ref 3–12)

## 2010-05-25 ENCOUNTER — Telehealth: Payer: Self-pay | Admitting: Cardiology

## 2010-05-25 NOTE — Telephone Encounter (Signed)
PT RIGHT LEG KEEP SWELLING SHE STATES SHE HAD HIT IT ON SOMETHING A FEW DAYS AGO AND IT KEEP SWELLING UP.

## 2010-07-05 ENCOUNTER — Other Ambulatory Visit: Payer: Self-pay | Admitting: *Deleted

## 2010-07-05 MED ORDER — CARVEDILOL 3.125 MG PO TABS: 3.1250 mg | ORAL_TABLET | Freq: Two times a day (BID) | ORAL | Status: DC

## 2010-07-05 MED ORDER — PRAVASTATIN SODIUM 40 MG PO TABS: 40.0000 mg | ORAL_TABLET | Freq: Every evening | ORAL | Status: DC

## 2010-07-17 ENCOUNTER — Other Ambulatory Visit (HOSPITAL_COMMUNITY): Payer: Self-pay | Admitting: Family Medicine

## 2010-07-17 DIAGNOSIS — Z139 Encounter for screening, unspecified: Secondary | ICD-10-CM

## 2010-07-19 ENCOUNTER — Other Ambulatory Visit: Payer: Self-pay | Admitting: *Deleted

## 2010-07-19 MED ORDER — CAPTOPRIL 25 MG PO TABS: 25.0000 mg | ORAL_TABLET | Freq: Two times a day (BID) | ORAL | Status: DC

## 2010-07-26 ENCOUNTER — Ambulatory Visit (HOSPITAL_COMMUNITY)
Admission: RE | Admit: 2010-07-26 | Discharge: 2010-07-26 | Disposition: A | Discharge status: Home / Self Care | Payer: Self-pay | Source: Ambulatory Visit | Attending: Family Medicine | Admitting: Family Medicine

## 2010-07-26 DIAGNOSIS — Z139 Encounter for screening, unspecified: Secondary | ICD-10-CM

## 2010-08-01 ENCOUNTER — Telehealth: Payer: Self-pay | Admitting: Cardiology

## 2010-08-01 NOTE — Telephone Encounter (Signed)
Left message for pt to call Deliah Goody

## 2010-08-01 NOTE — Telephone Encounter (Signed)
She is a patient in [place] but they told her to call here for Plavix thru the drug plan.  She has 10 days of medication left and needs to know what to do now?  She can pick this up at either place once it comes in.

## 2010-08-02 ENCOUNTER — Telehealth: Payer: Self-pay | Admitting: *Deleted

## 2010-08-02 DIAGNOSIS — I2510 Atherosclerotic heart disease of native coronary artery without angina pectoris: Secondary | ICD-10-CM

## 2010-08-02 MED ORDER — CLOPIDOGREL BISULFATE 75 MG PO TABS: 75.0000 mg | ORAL_TABLET | Freq: Every day | ORAL | Status: DC

## 2010-08-02 NOTE — Telephone Encounter (Signed)
I do not see any office visits for this patient. H and P from February review. Need to make sure she has a visit scheduled. Plavix is now generic so she may not actually require an assistance program. Has she checked the cost of the generic? Otherwise we could consider a different medication that is available through an assistance program.

## 2010-08-02 NOTE — Telephone Encounter (Signed)
Spoke with [facility] - last refill was sent out 08/01/10 per Thayer Ohm.  Pt aware and we will call her once we receive the med.  Pt also was given the website for [email or web address] for further assistance.    She thanked me for my time and help.

## 2010-08-02 NOTE — Telephone Encounter (Signed)
Spoke with pt who states [facility] sent her a letter stating they have one more 90 day supply for her.  I will call them to verify and if so have them sent it out.  Also discussed with pt the Rxrelief website that may offer her assistance with medication.  She requested a refill be sent to [facility] in [place]

## 2010-08-02 NOTE — Telephone Encounter (Signed)
There is no longer a drug assistance program for Plavix due to it being generic now.  We do not have samples either.  Need to ask Dr Diona Browner if medication can be changed to another that has a pt assistance program or samples.

## 2010-10-11 ENCOUNTER — Encounter: Payer: Self-pay | Admitting: Adult Health

## 2010-10-11 ENCOUNTER — Encounter: Payer: Self-pay | Admitting: *Deleted

## 2010-10-11 ENCOUNTER — Ambulatory Visit (INDEPENDENT_AMBULATORY_CARE_PROVIDER_SITE_OTHER): Payer: Self-pay | Admitting: Adult Health

## 2010-10-11 DIAGNOSIS — I2510 Atherosclerotic heart disease of native coronary artery without angina pectoris: Secondary | ICD-10-CM

## 2010-10-11 DIAGNOSIS — E7800 Pure hypercholesterolemia, unspecified: Secondary | ICD-10-CM

## 2010-10-11 NOTE — Progress Notes (Signed)
HPI: Erika Harrison is a 55 y/o patient of Dr. Dietrich Pates we are seeing on 6 months follow-up for known history of NSTEMI in the setting of ulcerated plaque in the mid LAD, nonobstructive CAD per cath 2./16/2012. This demonstrated complete healing of the LAD ulceration. She comes today very anxious about her status. She denies symptoms with the exception of some left arm tenderness at times, that is relieved with tylenol. She is afraid to do anything exertional for fear that she will have an MI.  She brings with her questions about her activities and medications.  No Known Allergies  Current Outpatient Prescriptions  Medication Sig Dispense Refill  . aspirin 81 MG EC tablet Take 81 mg by mouth daily.        . Calcium Carbonate (CALTRATE 600) 1500 MG TABS Take 1 tablet by mouth daily.        . captopril (CAPOTEN) 25 MG tablet Take 1 tablet (25 mg total) by mouth 2 (two) times daily.  60 tablet  3  . carvedilol (COREG) 3.125 MG tablet Take 1 tablet (3.125 mg total) by mouth 2 (two) times daily.  60 tablet  6  . clonazePAM (KLONOPIN) 1 MG tablet Take 0.5-1 mg by mouth daily as needed.        . clopidogrel (PLAVIX) 75 MG tablet Take 1 tablet (75 mg total) by mouth daily.  90 tablet  3  . nitroGLYCERIN (NITROSTAT) 0.4 MG SL tablet Place 0.4 mg under the tongue every 5 (five) minutes as needed.        Marland Kitchen PARoxetine (PAXIL) 40 MG tablet Take 40 mg by mouth every morning.        . polycarbophil (FIBERCON) 625 MG tablet Take 625 mg by mouth daily.        . pravastatin (PRAVACHOL) 40 MG tablet Take 1 tablet (40 mg total) by mouth every evening.  30 tablet  11    Past Medical History  Diagnosis Date  . Hypertension   . Depression with anxiety   . Non-ST elevation MI (NSTEMI)   . Coronary artery disease     nonobstruction  . Mitral valve prolapse 1995    Past Surgical History  Procedure Date  . Post laparscopic abdominal surgery   . Right breast surgery     [id number] of systems complete and  found to be negative unless listed above PHYSICAL EXAM BP 115/78  Pulse 81  Ht 5\' 3"  (1.6 m)  Wt 134 lb (60.782 kg)  BMI 23.74 kg/m2 General: Well developed, well nourished, in no acute distress Head: Eyes PERRLA, No xanthomas.   Normal cephalic and atramatic  Lungs: Clear bilaterally to auscultation and percussion. Heart: HRRR S1 S2,   Pulses are 2+ & equal.            No carotid bruit. No JVD.  No abdominal bruits. No femoral bruits. ANeuro: Alert and oriented X 3. Psych:  Good affect, responds appropriately, mild anxiety    ASSESSMENT AND PLAN

## 2010-10-11 NOTE — Assessment & Plan Note (Signed)
Other than anxiety, she appears stable from cardiac standpoint. BP and HR are stable.  She is given answers to her multiple questions to include increasing her activity, ok to take fish oil, able to lift baskets of clothes and to avoid caffeine.  She is to go to the beach this weekend and I have encouraged her to do this and not to worry. She verbalizes understanding.  We will see her in 6 months.

## 2010-10-11 NOTE — Assessment & Plan Note (Signed)
She is to have follow-up labs every 6 months.. Most recent were completed in March. She is due next month and will have them drawn at that time.

## 2010-10-11 NOTE — Patient Instructions (Signed)
Your physician recommends that you schedule a follow-up appointment in: 6 MONTHS 

## 2010-10-30 ENCOUNTER — Other Ambulatory Visit: Payer: Self-pay | Admitting: Adult Health

## 2010-10-31 LAB — LIPID PANEL: HDL: 58 mg/dL (ref >39)

## 2011-02-26 ENCOUNTER — Encounter (HOSPITAL_COMMUNITY): Payer: Self-pay

## 2011-02-26 ENCOUNTER — Other Ambulatory Visit: Payer: Self-pay

## 2011-02-26 ENCOUNTER — Emergency Department (HOSPITAL_COMMUNITY): Payer: Self-pay

## 2011-02-26 ENCOUNTER — Observation Stay (HOSPITAL_COMMUNITY)
Admission: EM | Admit: 2011-02-26 | Discharge: 2011-02-27 | Disposition: A | Discharge status: Home / Self Care | Payer: Self-pay | Attending: Internal Medicine | Admitting: Internal Medicine

## 2011-02-26 DIAGNOSIS — R079 Chest pain, unspecified: Secondary | ICD-10-CM | POA: Diagnosis present

## 2011-02-26 DIAGNOSIS — I252 Old myocardial infarction: Secondary | ICD-10-CM | POA: Insufficient documentation

## 2011-02-26 DIAGNOSIS — E7800 Pure hypercholesterolemia, unspecified: Secondary | ICD-10-CM | POA: Insufficient documentation

## 2011-02-26 DIAGNOSIS — F418 Other specified anxiety disorders: Secondary | ICD-10-CM | POA: Diagnosis present

## 2011-02-26 DIAGNOSIS — I10 Essential (primary) hypertension: Secondary | ICD-10-CM | POA: Insufficient documentation

## 2011-02-26 DIAGNOSIS — R0789 Other chest pain: Principal | ICD-10-CM | POA: Insufficient documentation

## 2011-02-26 DIAGNOSIS — F341 Dysthymic disorder: Secondary | ICD-10-CM | POA: Insufficient documentation

## 2011-02-26 DIAGNOSIS — I2510 Atherosclerotic heart disease of native coronary artery without angina pectoris: Secondary | ICD-10-CM | POA: Insufficient documentation

## 2011-02-26 DIAGNOSIS — E782 Mixed hyperlipidemia: Secondary | ICD-10-CM | POA: Diagnosis present

## 2011-02-26 DIAGNOSIS — I214 Non-ST elevation (NSTEMI) myocardial infarction: Secondary | ICD-10-CM

## 2011-02-26 LAB — POCT I-STAT TROPONIN I: Troponin i, poc: 0 ng/mL (ref 0.00–0.08)

## 2011-02-26 NOTE — ED Provider Notes (Signed)
Medical screening examination/treatment/procedure(s) were conducted as a shared visit with non-physician practitioner(s) and myself.  I personally evaluated the patient during the encounter  I was present during the history and physical.   Hanley Seamen, MD 02/26/11 2340

## 2011-02-26 NOTE — ED Notes (Signed)
Onset of left chest pain approx 6 pm tonight, with pain radiating into left breast area, states she took total of 2 nitro at home with relief.  Pt also took total of 324 aspirin.    Pt states pain is 2/10 at this time.

## 2011-02-27 ENCOUNTER — Encounter (HOSPITAL_COMMUNITY): Payer: Self-pay | Admitting: Internal Medicine

## 2011-02-27 DIAGNOSIS — F418 Other specified anxiety disorders: Secondary | ICD-10-CM | POA: Diagnosis present

## 2011-02-27 DIAGNOSIS — I214 Non-ST elevation (NSTEMI) myocardial infarction: Secondary | ICD-10-CM | POA: Insufficient documentation

## 2011-02-27 DIAGNOSIS — R079 Chest pain, unspecified: Secondary | ICD-10-CM | POA: Diagnosis present

## 2011-02-27 DIAGNOSIS — I10 Essential (primary) hypertension: Secondary | ICD-10-CM | POA: Diagnosis present

## 2011-02-27 LAB — CARDIAC PANEL(CRET KIN+CKTOT+MB+TROPI)
CK, MB: 2.1 ng/mL (ref 0.3–4.0)
Relative Index: INVALID (ref 0.0–2.5)
Relative Index: INVALID (ref 0.0–2.5)
Total CK: 58 U/L (ref 7–177)
Total CK: 53 U/L (ref 7–177)
Troponin I: <0.3 ng/mL (ref <0.30)
Troponin I: <0.3 ng/mL (ref <0.30)

## 2011-02-27 LAB — CBC
MCV: 89.6 fL (ref 78.0–100.0)
Platelets: 231 10*3/uL (ref 150–400)
Platelets: 199 10*3/uL (ref 150–400)
RBC: 3.93 MIL/uL (ref 3.87–5.11)
RDW: 11.8 % (ref 11.5–15.5)
RDW: 11.8 % (ref 11.5–15.5)
WBC: 3.8 10*3/uL — ABNORMAL LOW (ref 4.0–10.5)
WBC: 3.3 10*3/uL — ABNORMAL LOW (ref 4.0–10.5)

## 2011-02-27 LAB — BASIC METABOLIC PANEL
CO2: 29 mEq/L (ref 19–32)
Calcium: 10 mg/dL (ref 8.4–10.5)
Calcium: 9.7 mg/dL (ref 8.4–10.5)
Creatinine, Ser: 0.63 mg/dL (ref 0.50–1.10)
GFR calc Af Amer: >90 mL/min (ref >90)
GFR calc non Af Amer: >90 mL/min (ref >90)
GFR calc non Af Amer: >90 mL/min (ref >90)
Sodium: 137 mEq/L (ref 135–145)
Sodium: 136 mEq/L (ref 135–145)

## 2011-02-27 LAB — DIFFERENTIAL
Basophils Absolute: 0 10*3/uL (ref 0.0–0.1)
Eosinophils Absolute: 0.1 10*3/uL (ref 0.0–0.7)
Eosinophils Relative: 2 % (ref 0–5)
Lymphocytes Relative: 46 % (ref 12–46)
Neutrophils Relative %: 42 % — ABNORMAL LOW (ref 43–77)

## 2011-02-27 MED ORDER — CALCIUM POLYCARBOPHIL 625 MG PO TABS
625.0000 mg | ORAL_TABLET | Freq: Every day | ORAL | Status: DC
  Administered 2011-02-27: 625 mg via ORAL
  Filled 2011-02-27 (×3): qty 1

## 2011-02-27 MED ORDER — SODIUM CHLORIDE 0.9 % IJ SOLN
3.0000 mL | Freq: Two times a day (BID) | INTRAMUSCULAR | Status: DC
  Administered 2011-02-27 (×2): 3 mL via INTRAVENOUS
  Filled 2011-02-27 (×2): qty 3

## 2011-02-27 MED ORDER — ENOXAPARIN SODIUM 40 MG/0.4ML ~~LOC~~ SOLN
40.0000 mg | SUBCUTANEOUS | Status: DC
  Administered 2011-02-27: 40 mg via SUBCUTANEOUS
  Filled 2011-02-27: qty 0.4

## 2011-02-27 MED ORDER — ACETAMINOPHEN 325 MG PO TABS: 650.0000 mg | ORAL_TABLET | ORAL | Status: DC | PRN

## 2011-02-27 MED ORDER — ONDANSETRON HCL 4 MG/2ML IJ SOLN: 4.0000 mg | Freq: Four times a day (QID) | INTRAMUSCULAR | Status: DC | PRN

## 2011-02-27 MED ORDER — CLONAZEPAM 0.5 MG PO TABS
0.5000 mg | ORAL_TABLET | Freq: Two times a day (BID) | ORAL | Status: DC | PRN
  Administered 2011-02-27: 0.5 mg via ORAL
  Filled 2011-02-27: qty 1

## 2011-02-27 MED ORDER — PAROXETINE HCL 20 MG PO TABS
40.0000 mg | ORAL_TABLET | ORAL | Status: DC
  Administered 2011-02-27: 40 mg via ORAL
  Filled 2011-02-27: qty 2

## 2011-02-27 MED ORDER — CAPTOPRIL 25 MG PO TABS
25.0000 mg | ORAL_TABLET | Freq: Two times a day (BID) | ORAL | Status: DC
  Administered 2011-02-27: 25 mg via ORAL
  Filled 2011-02-27: qty 1

## 2011-02-27 MED ORDER — CARVEDILOL 3.125 MG PO TABS
3.1250 mg | ORAL_TABLET | Freq: Two times a day (BID) | ORAL | Status: DC
  Administered 2011-02-27: 3.125 mg via ORAL
  Filled 2011-02-27: qty 1

## 2011-02-27 MED ORDER — NITROGLYCERIN 0.4 MG SL SUBL: 0.4000 mg | SUBLINGUAL_TABLET | Freq: Once | SUBLINGUAL | Status: DC

## 2011-02-27 MED ORDER — ASPIRIN EC 81 MG PO TBEC
81.0000 mg | DELAYED_RELEASE_TABLET | Freq: Every day | ORAL | Status: DC
  Administered 2011-02-27: 81 mg via ORAL
  Filled 2011-02-27 (×2): qty 1

## 2011-02-27 MED ORDER — ALUM & MAG HYDROXIDE-SIMETH 200-200-20 MG/5ML PO SUSP: 30.0000 mL | Freq: Four times a day (QID) | ORAL | Status: DC | PRN

## 2011-02-27 MED ORDER — CLONAZEPAM 0.5 MG PO TABS: 0.5000 mg | ORAL_TABLET | Freq: Every day | ORAL | Status: DC

## 2011-02-27 MED ORDER — TRAZODONE 25 MG HALF TABLET
25.0000 mg | ORAL_TABLET | Freq: Every evening | ORAL | Status: DC | PRN
  Filled 2011-02-27: qty 1

## 2011-02-27 MED ORDER — ACETAMINOPHEN 650 MG RE SUPP: 650.0000 mg | Freq: Four times a day (QID) | RECTAL | Status: DC | PRN

## 2011-02-27 MED ORDER — SODIUM CHLORIDE 0.9 % IV SOLN: 250.0000 mL | INTRAVENOUS | Status: DC | PRN

## 2011-02-27 MED ORDER — SIMVASTATIN 20 MG PO TABS: 20.0000 mg | ORAL_TABLET | Freq: Every day | ORAL | Status: DC

## 2011-02-27 MED ORDER — CLOPIDOGREL BISULFATE 75 MG PO TABS
75.0000 mg | ORAL_TABLET | Freq: Every day | ORAL | Status: DC
  Administered 2011-02-27: 75 mg via ORAL
  Filled 2011-02-27: qty 1

## 2011-02-27 MED ORDER — NITROGLYCERIN 2 % TD OINT
0.5000 [in_us] | TOPICAL_OINTMENT | Freq: Four times a day (QID) | TRANSDERMAL | Status: DC
  Administered 2011-02-27: 0.5 [in_us] via TOPICAL
  Filled 2011-02-27: qty 1

## 2011-02-27 MED ORDER — OXYCODONE HCL 5 MG PO TABS: 5.0000 mg | ORAL_TABLET | ORAL | Status: DC | PRN

## 2011-02-27 MED ORDER — SODIUM CHLORIDE 0.9 % IJ SOLN: 3.0000 mL | INTRAMUSCULAR | Status: DC | PRN

## 2011-02-27 MED ORDER — MORPHINE SULFATE 2 MG/ML IJ SOLN: 1.0000 mg | INTRAMUSCULAR | Status: DC | PRN

## 2011-02-27 MED ORDER — ONDANSETRON HCL 4 MG PO TABS: 4.0000 mg | ORAL_TABLET | ORAL | Status: DC | PRN

## 2011-02-27 NOTE — Discharge Summary (Signed)
DISCHARGE SUMMARY  Erika Harrison  MR#: [id number]  DOB:[birth year]  Date of Admission: 02/26/2011 Date of Discharge: 02/27/2011  Attending Physician:Bret Stamour K  Patient's WUJ:WJXB,JYNWGNFA L, PA, PA-C  Consults:  case reviewed with [facility]  Discharge Diagnoses: Present on Admission:  .Chest pain .CAD .Depression with anxiety .HYPERCHOLESTEROLEMIA .Hypertension    Current Discharge Medication List    CONTINUE these medications which have NOT CHANGED   Details  aspirin 81 MG EC tablet Take 81 mg by mouth daily.      Calcium Carbonate (CALTRATE 600) 1500 MG TABS Take 1 tablet by mouth daily.      captopril (CAPOTEN) 25 MG tablet Take 1 tablet (25 mg total) by mouth 2 (two) times daily. Qty: 60 tablet, Refills: 3    carvedilol (COREG) 3.125 MG tablet Take 1 tablet (3.125 mg total) by mouth 2 (two) times daily. Qty: 60 tablet, Refills: 6    clonazePAM (KLONOPIN) 1 MG tablet Take 0.5-1 mg by mouth daily as needed.      clopidogrel (PLAVIX) 75 MG tablet Take 1 tablet (75 mg total) by mouth daily. Qty: 90 tablet, Refills: 3   Associated Diagnoses: CAD (coronary artery disease)    nitroGLYCERIN (NITROSTAT) 0.4 MG SL tablet Place 0.4 mg under the tongue every 5 (five) minutes as needed.      Omega-3 Fatty Acids (FISH OIL) 1000 MG CAPS Take 1 capsule by mouth daily.      PARoxetine (PAXIL) 40 MG tablet Take 40 mg by mouth every morning.     polycarbophil (FIBERCON) 625 MG tablet Take 625 mg by mouth daily.      pravastatin (PRAVACHOL) 40 MG tablet Take 1 tablet (40 mg total) by mouth every evening. Qty: 30 tablet, Refills: 11          Hospital Course: Present on Admission:  .Chest pain: EKG unremarkable. Enzymes cycled and negative. Pain felt to not be related to acute coronary syndrome. Patient cleared to be discharged home with planned followup with [facility] next week.  .CAD: Stable.  .Depression with anxiety:  Stable.  Marland KitchenHYPERCHOLESTEROLEMIA: Stable. Continue home medication.  .Hypertension:: Stable. Continue antihypertensives   Day of Discharge BP 93/61  Pulse 57  Temp(Src) 97.3 F (36.3 C) (Oral)  Resp 18  Ht 5\' 3"  (1.6 m)  Wt 60.9 kg (134 lb 4.2 oz)  BMI 23.78 kg/m2  SpO2 95%  LMP 12/27/2010  Physical Exam: Gen.: Alert and oriented x3, no acute distress. Looks stated age, fatigued HEENT: Normocephalic, atraumatic, mucous her meds are moist Cardiovascular: Regular rate and rhythm S1-S2 Lungs:" Bilaterally Abdomen: Soft, nontender, nondistended, positive bowel sounds Extremities: No clubbing or cyanosis or edema  Results for orders placed during the hospital encounter of 02/26/11 (from the past 24 hour(s))  CBC     Status: Abnormal   Collection Time   02/26/11 11:15 PM      Component Value Range   WBC 3.8 (*) 4.0 - 10.5 (K/uL)   RBC 4.12  3.87 - 5.11 (MIL/uL)   Hemoglobin 12.8  12.0 - 15.0 (g/dL)   HCT 21.3  08.6 - 57.8 (%)   MCV 89.6  78.0 - 100.0 (fL)   MCH 31.1  26.0 - 34.0 (pg)   MCHC 34.7  30.0 - 36.0 (g/dL)   RDW 46.9  62.9 - 52.8 (%)   Platelets 231  150 - 400 (K/uL)  DIFFERENTIAL     Status: Abnormal   Collection Time   02/26/11 11:15 PM  Component Value Range   Neutrophils Relative 42 (*) 43 - 77 (%)   Neutro Abs 1.6 (*) 1.7 - 7.7 (K/uL)   Lymphocytes Relative 46  12 - 46 (%)   Lymphs Abs 1.8  0.7 - 4.0 (K/uL)   Monocytes Relative 9  3 - 12 (%)   Monocytes Absolute 0.4  0.1 - 1.0 (K/uL)   Eosinophils Relative 2  0 - 5 (%)   Eosinophils Absolute 0.1  0.0 - 0.7 (K/uL)   Basophils Relative 1  0 - 1 (%)   Basophils Absolute 0.0  0.0 - 0.1 (K/uL)  BASIC METABOLIC PANEL     Status: Abnormal   Collection Time   02/26/11 11:15 PM      Component Value Range   Sodium 137  135 - 145 (mEq/L)   Potassium 3.6  3.5 - 5.1 (mEq/L)   Chloride 101  96 - 112 (mEq/L)   CO2 29  19 - 32 (mEq/L)   Glucose, Bld 103 (*) 70 - 99 (mg/dL)   BUN 22  6 - 23 (mg/dL)   Creatinine,  Ser 1.61  0.50 - 1.10 (mg/dL)   Calcium 09.6  8.4 - 10.5 (mg/dL)   GFR calc non Af Amer >90  >90 (mL/min)   GFR calc Af Amer >90  >90 (mL/min)  POCT I-STAT TROPONIN I     Status: Normal   Collection Time   02/26/11 11:30 PM      Component Value Range   Troponin i, poc 0.00  0.00 - 0.08 (ng/mL)   Comment 3           BASIC METABOLIC PANEL     Status: Normal   Collection Time   02/27/11  5:17 AM      Component Value Range   Sodium 136  135 - 145 (mEq/L)   Potassium 3.6  3.5 - 5.1 (mEq/L)   Chloride 103  96 - 112 (mEq/L)   CO2 28  19 - 32 (mEq/L)   Glucose, Bld 95  70 - 99 (mg/dL)   BUN 19  6 - 23 (mg/dL)   Creatinine, Ser 0.45  0.50 - 1.10 (mg/dL)   Calcium 9.7  8.4 - 40.9 (mg/dL)   GFR calc non Af Amer >90  >90 (mL/min)   GFR calc Af Amer >90  >90 (mL/min)  CBC     Status: Abnormal   Collection Time   02/27/11  5:17 AM      Component Value Range   WBC 3.3 (*) 4.0 - 10.5 (K/uL)   RBC 3.93  3.87 - 5.11 (MIL/uL)   Hemoglobin 12.3  12.0 - 15.0 (g/dL)   HCT 81.1 (*) 91.4 - 46.0 (%)   MCV 89.8  78.0 - 100.0 (fL)   MCH 31.3  26.0 - 34.0 (pg)   MCHC 34.8  30.0 - 36.0 (g/dL)   RDW 78.2  95.6 - 21.3 (%)   Platelets 199  150 - 400 (K/uL)  CARDIAC PANEL(CRET KIN+CKTOT+MB+TROPI)     Status: Normal   Collection Time   02/27/11  5:20 AM      Component Value Range   Total CK 58  7 - 177 (U/L)   CK, MB 2.1  0.3 - 4.0 (ng/mL)   Troponin I <0.30  <0.30 (ng/mL)   Relative Index RELATIVE INDEX IS INVALID  0.0 - 2.5   CARDIAC PANEL(CRET KIN+CKTOT+MB+TROPI)     Status: Normal   Collection Time   02/27/11 12:52 PM  Component Value Range   Total CK 53  7 - 177 (U/L)   CK, MB 2.1  0.3 - 4.0 (ng/mL)   Troponin I <0.30  <0.30 (ng/mL)   Relative Index RELATIVE INDEX IS INVALID  0.0 - 2.5     Disposition: Improved, chest pain resolved. Feeling fine no complaints. Being discharged home   Follow-up Appts: Discharge Orders    Future Appointments: Provider: Department: Dept Phone: Center:    03/20/2011 11:20 AM Joni Reining, NP [facility] [phone] [id number]     Future Orders Please Complete By Expires   Diet - low sodium heart healthy      Increase activity slowly         Follow-up with Rosalie Gums, nurse practitioner for [facility] next week.  She will followup with her primary care provider, Lenise Herald, PA for [facility] in 3 weeks   Tests Needing Follow-up: None  Time spent in discharge (includes decision making & examination of pt): 35 minutes  Signed: Hollice Espy 02/27/2011, 4:30 PM

## 2011-02-27 NOTE — Progress Notes (Signed)
Patient being d/c home. Instructions given and patient verbalize understanding. IV cath removed and patient tolerates well. Getting dressed @ this time.

## 2011-02-27 NOTE — ED Notes (Signed)
Dr Orvan Falconer here to see pt for admission at this time

## 2011-02-27 NOTE — H&P (Signed)
PCP:   No primary provider on file.   Chief Complaint:  Chest pain since this evening  HPI: Erika Harrison is an 56 y.o. Caucasian female.  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 Repeat cath showed complete healing and non obstructive disease; he is being followed by [facility] at  six-month intervals. Patient does suffer from anxiety and since her MI, is very concerned about episodic chest pains.   While cooking supper this evening she developed a central chest pressure and moving toward her left shoulder, which she ignored; later she decided to lie down while resting the pain seemed to become more intense, she took one of her Klonopin tablets which did not seem to give much relief, then discuss it with her daughter who recommended a sublingual nitroglycerin and 2 baby aspirin, which did seem to ease the pain somewhat. EMS was called and she received more nitroglycerin en route. On arrival to the emergency room the pain had completely resolved, however because of her history the hospitalist service was called to assist with management.  Pain did recur while she was waiting in the hand is at the level of about 2-5/10  Denies nausea vomiting diaphoresis dizziness or syncope.  Exercises by jogging about 30 minutes daily says she's been doing this even before she had her previous heart attack.  Further history is been curtailed because patient says she feels as if she is being "interrogated" and is becoming quite tearful.   Rewiew of Systems:  The patient denies anorexia, fever, weight loss,, vision loss, decreased hearing, hoarseness, chest pain, syncope, dyspnea on exertion, peripheral edema, balance deficits, hemoptysis, abdominal pain, melena, hematochezia, severe indigestion/heartburn, hematuria, incontinence, genital sores, muscle weakness, suspicious skin lesions, transient blindness, difficulty walking, depression, unusual weight change, abnormal bleeding,  enlarged lymph nodes, angioedema, and breast masses.    Past Medical History  Diagnosis Date  . Hypertension   . Depression with anxiety   . Non-ST elevation MI (NSTEMI)   . Coronary artery disease     nonobstruction  . Mitral valve prolapse 1995    Past Surgical History  Procedure Date  . Post laparscopic abdominal surgery   . Right breast surgery     Medications:  HOME MEDS: Prior to Admission medications   Medication Sig Start Date End Date Taking? Authorizing Provider  aspirin 81 MG EC tablet Take 81 mg by mouth daily.     Yes Historical Provider, MD  Calcium Carbonate (CALTRATE 600) 1500 MG TABS Take 1 tablet by mouth daily.     Yes Historical Provider, MD  captopril (CAPOTEN) 25 MG tablet Take 1 tablet (25 mg total) by mouth 2 (two) times daily. 07/19/10  Yes Joni Reining, NP  carvedilol (COREG) 3.125 MG tablet Take 1 tablet (3.125 mg total) by mouth 2 (two) times daily. 07/05/10 07/05/11 Yes Wendall Stade, MD  clonazePAM (KLONOPIN) 1 MG tablet Take 0.5-1 mg by mouth daily as needed.     Yes Historical Provider, MD  clopidogrel (PLAVIX) 75 MG tablet Take 1 tablet (75 mg total) by mouth daily. 08/02/10 08/02/11 Yes Jonelle Sidle, MD  nitroGLYCERIN (NITROSTAT) 0.4 MG SL tablet Place 0.4 mg under the tongue every 5 (five) minutes as needed.     Yes Historical Provider, MD  PARoxetine (PAXIL) 40 MG tablet Take 40 mg by mouth every morning.     Yes Historical Provider, MD  polycarbophil (FIBERCON) 625 MG tablet Take 625 mg by mouth daily.  Yes Historical Provider, MD  pravastatin (PRAVACHOL) 40 MG tablet Take 1 tablet (40 mg total) by mouth every evening. 07/05/10 07/05/11 Yes Wendall Stade, MD     Allergies:  No Known Allergies  Social History:   reports that she has never smoked. She does not have any smokeless tobacco history on file. She reports that she does not drink alcohol or use illicit drugs.  Family History: Family History  Problem Relation Age of Onset    . Cancer Father     renal cell carcinoma  . Rheum arthritis Brother   . Hypertension Sister   . Drug abuse Brother      Physical Exam: Filed Vitals:   02/26/11 2307 02/27/11 0024 02/27/11 0204  BP: 101/69 120/69 118/71  Pulse: 65    Temp: 98.7 F (37.1 C)    TempSrc: Oral    Resp: 18 16 18   Height: 5\' 3"  (1.6 m)    Weight: 59.875 kg (132 lb)    SpO2: 95% 98% 98%   Blood pressure 118/71, pulse 65, temperature 98.7 F (37.1 C), temperature source Oral, resp. rate 18, height 5\' 3"  (1.6 m), weight 59.875 kg (132 lb), last menstrual period 12/27/2010, SpO2 98.00%.  GEN: Depressed-looking middle-aged Caucasian lady lying in the stretcher; cooperative with exam PSYCH:  alert and oriented x4;  HEENT: Mucous membranes pink and anicteric; PERRLA; EOM intact; no cervical lymphadenopathy nor thyromegaly or carotid bruit; no JVD; Breasts:: Not examined CHEST WALL: Tender over the manubrium sternum  CHEST: Normal respiration, clear to auscultation bilaterally HEART: Regular rate and rhythm; no murmurs rubs or gallops BACK: No kyphosis or scoliosis; no CVA tenderness ABDOMEN: Obese, soft non-tender; no masses, no organomegaly, normal abdominal bowel sounds;  Rectal Exam: Not done EXTREMITIES: No bone or joint deformity; no edema; no ulcerations. Genitalia: not examined PULSES: 2+ and symmetric SKIN: Normal hydration no rash or ulceration CNS: Cranial nerves 2-12 grossly intact no focal neurologic deficit   Labs & Imaging Results for orders placed during the hospital encounter of 02/26/11 (from the past 48 hour(s))  CBC     Status: Abnormal   Collection Time   02/26/11 11:15 PM      Component Value Range Comment   WBC 3.8 (*) 4.0 - 10.5 (K/uL)    RBC 4.12  3.87 - 5.11 (MIL/uL)    Hemoglobin 12.8  12.0 - 15.0 (g/dL)    HCT 16.1  09.6 - 04.5 (%)    MCV 89.6  78.0 - 100.0 (fL)    MCH 31.1  26.0 - 34.0 (pg)    MCHC 34.7  30.0 - 36.0 (g/dL)    RDW 40.9  81.1 - 91.4 (%)    Platelets  231  150 - 400 (K/uL)   DIFFERENTIAL     Status: Abnormal   Collection Time   02/26/11 11:15 PM      Component Value Range Comment   Neutrophils Relative 42 (*) 43 - 77 (%)    Neutro Abs 1.6 (*) 1.7 - 7.7 (K/uL)    Lymphocytes Relative 46  12 - 46 (%)    Lymphs Abs 1.8  0.7 - 4.0 (K/uL)    Monocytes Relative 9  3 - 12 (%)    Monocytes Absolute 0.4  0.1 - 1.0 (K/uL)    Eosinophils Relative 2  0 - 5 (%)    Eosinophils Absolute 0.1  0.0 - 0.7 (K/uL)    Basophils Relative 1  0 - 1 (%)    Basophils  Absolute 0.0  0.0 - 0.1 (K/uL)   BASIC METABOLIC PANEL     Status: Abnormal   Collection Time   02/26/11 11:15 PM      Component Value Range Comment   Sodium 137  135 - 145 (mEq/L)    Potassium 3.6  3.5 - 5.1 (mEq/L)    Chloride 101  96 - 112 (mEq/L)    CO2 29  19 - 32 (mEq/L)    Glucose, Bld 103 (*) 70 - 99 (mg/dL)    BUN 22  6 - 23 (mg/dL)    Creatinine, Ser 0.45  0.50 - 1.10 (mg/dL)    Calcium 40.9  8.4 - 10.5 (mg/dL)    GFR calc non Af Amer >90  >90 (mL/min)    GFR calc Af Amer >90  >90 (mL/min)   POCT I-STAT TROPONIN I     Status: Normal   Collection Time   02/26/11 11:30 PM      Component Value Range Comment   Troponin i, poc 0.00  0.00 - 0.08 (ng/mL)    Comment 3             Dg Chest Portable 1 View  02/26/2011  *RADIOLOGY REPORT*  Clinical Data: Chest pain  PORTABLE CHEST - 1 VIEW  Comparison: None.  Findings: The heart size and pulmonary vascularity are normal. The lungs appear clear and expanded without focal air space disease or consolidation. No blunting of the costophrenic angles.  No pneumothorax.  IMPRESSION: No evidence of active pulmonary disease.  Original Report Authenticated By: Marlon Pel, M.D.      Assessment Present on Admission:  .Chest pain, relieved by nitroglycerin Chest wall tenderness .Depression with anxiety .CAD .HYPERCHOLESTEROLEMIA .Hypertension   PLAN: We'll bring this lady on a chest pain rule out, and allow her to be evaluated by her  cardiologist as an inpatient or outpatient   Other plans as per orders.    Penda Venturi 02/27/2011, 2:49 AM

## 2011-02-27 NOTE — ED Provider Notes (Signed)
History     CSN: [id number]  Arrival date & time 02/26/11  2258   First MD Initiated Contact with Patient 02/26/11 2310      Chief Complaint  Patient presents with  . Chest Pain    (Consider location/radiation/quality/duration/timing/severity/associated sxs/prior treatment) Patient is a 56 y.o. female presenting with chest pain. The history is provided by the patient.  Chest Pain The chest pain began 3 - 5 hours ago. Chest pain occurs constantly. The chest pain is resolved. Associated with: nothing. At its most intense, the pain is at 6/10. The pain is currently at 2/10. The severity of the pain is moderate. The quality of the pain is described as burning and dull. The pain does not radiate. Exacerbated by: worsened by nothing. Pertinent negatives for primary symptoms include no fever, no fatigue, no syncope, no shortness of breath, no abdominal pain, no nausea, no vomiting and no dizziness.  Pertinent negatives for associated symptoms include no lower extremity edema, no numbness and no weakness. Associated symptoms comments: She reports episodes of palpitations and "skipped beats" since her MI 2/12,  Sometimes lasting long enough to make her feel lightheaded and near syncopal.  She has not discussed this symptom with her cardiologist.  . She tried nitroglycerin and aspirin (SL ntg x 2.  Pt also chewed ASA 324 mg.) for the symptoms.  Her past medical history is significant for CAD, hyperlipidemia and MI.  Procedure history is positive for cardiac catheterization and echocardiogram. Procedure history comments: cardiac cath x 2  in Feb 2012 revealing for resolution of LAD ulcerated plaque with heparin.  Not amenable to stenting..     Past Medical History  Diagnosis Date  . Hypertension   . Depression with anxiety   . Non-ST elevation MI (NSTEMI)   . Coronary artery disease     nonobstruction  . Mitral valve prolapse 1995    Past Surgical History  Procedure Date  . Post laparscopic  abdominal surgery   . Right breast surgery     Family History  Problem Relation Age of Onset  . Cancer Father     renal cell carcinoma  . Rheum arthritis Brother   . Hypertension Sister   . Drug abuse Brother     History  Substance Use Topics  . Smoking status: Never Smoker   . Smokeless tobacco: Not on file  . Alcohol Use: No    OB History    Grav Para Term Preterm Abortions TAB SAB Ect Mult Living                  Review of Systems  Constitutional: Negative for fever and fatigue.  HENT: Negative for congestion, sore throat and neck pain.   Eyes: Negative.   Respiratory: Negative for chest tightness and shortness of breath.   Cardiovascular: Positive for chest pain and leg swelling. Negative for syncope.  Gastrointestinal: Negative for nausea, vomiting and abdominal pain.  Genitourinary: Negative.   Musculoskeletal: Negative for joint swelling and arthralgias.  Skin: Negative.  Negative for rash and wound.  Neurological: Negative for dizziness, weakness, light-headedness, numbness and headaches.  Hematological: Negative.   Psychiatric/Behavioral: Negative.     Allergies  Review of patient's allergies indicates no known allergies.  Home Medications   Current Outpatient Rx  Name Route Sig Dispense Refill  . ASPIRIN 81 MG PO TBEC Oral Take 81 mg by mouth daily.      Marland Kitchen CALCIUM CARBONATE 1500 MG PO TABS Oral Take 1 tablet by  mouth daily.      Marland Kitchen CAPTOPRIL 25 MG PO TABS Oral Take 1 tablet (25 mg total) by mouth 2 (two) times daily. 60 tablet 3  . CARVEDILOL 3.125 MG PO TABS Oral Take 1 tablet (3.125 mg total) by mouth 2 (two) times daily. 60 tablet 6  . CLONAZEPAM 1 MG PO TABS Oral Take 0.5-1 mg by mouth daily as needed.      . CLOPIDOGREL BISULFATE 75 MG PO TABS Oral Take 1 tablet (75 mg total) by mouth daily. 90 tablet 3  . NITROGLYCERIN 0.4 MG SL SUBL Sublingual Place 0.4 mg under the tongue every 5 (five) minutes as needed.      Marland Kitchen PAROXETINE HCL 40 MG PO TABS Oral  Take 40 mg by mouth every morning.      Marland Kitchen CALCIUM POLYCARBOPHIL 625 MG PO TABS Oral Take 625 mg by mouth daily.      Marland Kitchen PRAVASTATIN SODIUM 40 MG PO TABS Oral Take 1 tablet (40 mg total) by mouth every evening. 30 tablet 11    BP 120/69  Pulse 65  Temp(Src) 98.7 F (37.1 C) (Oral)  Resp 16  Ht 5\' 3"  (1.6 m)  Wt 132 lb (59.875 kg)  BMI 23.38 kg/m2  SpO2 98%  LMP 12/27/2010  Physical Exam  Nursing note and vitals reviewed. Constitutional: She is oriented to person, place, and time. She appears well-developed and well-nourished.  HENT:  Head: Normocephalic and atraumatic.  Eyes: Conjunctivae are normal.  Neck: Normal range of motion.  Cardiovascular: Normal rate, regular rhythm, normal heart sounds and intact distal pulses.   No murmur heard. Pulmonary/Chest: Effort normal and breath sounds normal. No respiratory distress. She has no wheezes. She exhibits no tenderness.  Abdominal: Soft. Bowel sounds are normal. She exhibits no distension. There is no tenderness.  Musculoskeletal: Normal range of motion.  Neurological: She is alert and oriented to person, place, and time.  Skin: Skin is warm and dry.  Psychiatric: She has a normal mood and affect.    ED Course  Procedures (including critical care time)  Labs Reviewed  CBC - Abnormal; Notable for the following:    WBC 3.8 (*)    All other components within normal limits  DIFFERENTIAL - Abnormal; Notable for the following:    Neutrophils Relative 42 (*)    Neutro Abs 1.6 (*)    All other components within normal limits  BASIC METABOLIC PANEL - Abnormal; Notable for the following:    Glucose, Bld 103 (*)    All other components within normal limits  POCT I-STAT TROPONIN I  I-STAT TROPONIN I   Dg Chest Portable 1 View  02/26/2011  *RADIOLOGY REPORT*  Clinical Data: Chest pain  PORTABLE CHEST - 1 VIEW  Comparison: None.  Findings: The heart size and pulmonary vascularity are normal. The lungs appear clear and expanded without  focal air space disease or consolidation. No blunting of the costophrenic angles.  No pneumothorax.  IMPRESSION: No evidence of active pulmonary disease.  Original Report Authenticated By: Marlon Pel, M.D.     No diagnosis found.    MDM  Chest pain with known coronary disease,  With improvement with nitroglycerin taken prior to arrival.   Spoke with Dr. Orvan Falconer of [facility] who accepts pt for admission.   Date: 02/27/2011  Rate: 62  Rhythm: normal sinus rhythm  QRS Axis: normal  Intervals: normal  ST/T Wave abnormalities: normal  Conduction Disutrbances:none  Narrative Interpretation: ekg improved from 04/13/10  Old EKG Reviewed: changes noted          Candis Musa, [place] 02/27/11 0128

## 2011-03-20 ENCOUNTER — Encounter: Payer: Self-pay | Admitting: Adult Health

## 2011-06-27 ENCOUNTER — Telehealth: Payer: Self-pay | Admitting: Cardiology

## 2011-06-27 NOTE — Telephone Encounter (Signed)
PT NEEDS PRAVASTATIN 40MG  CALLED IN TO [facility]

## 2011-06-28 MED ORDER — PRAVASTATIN SODIUM 40 MG PO TABS: 40.0000 mg | ORAL_TABLET | Freq: Every evening | ORAL | Status: DC

## 2011-06-28 NOTE — Telephone Encounter (Signed)
lmom for pt RX sent into pharmacy, pt is to call back and make a f/u appt/ CT

## 2011-07-25 ENCOUNTER — Ambulatory Visit (INDEPENDENT_AMBULATORY_CARE_PROVIDER_SITE_OTHER): Payer: Self-pay | Admitting: Cardiology

## 2011-07-25 ENCOUNTER — Other Ambulatory Visit: Payer: Self-pay | Admitting: *Deleted

## 2011-07-25 ENCOUNTER — Encounter: Payer: Self-pay | Admitting: Cardiology

## 2011-07-25 VITALS — BP 109/67 | HR 68 | Resp 16 | Ht 63.0 in | Wt 135.0 lb

## 2011-07-25 DIAGNOSIS — E7800 Pure hypercholesterolemia, unspecified: Secondary | ICD-10-CM

## 2011-07-25 DIAGNOSIS — I2510 Atherosclerotic heart disease of native coronary artery without angina pectoris: Secondary | ICD-10-CM

## 2011-07-25 DIAGNOSIS — I10 Essential (primary) hypertension: Secondary | ICD-10-CM

## 2011-07-25 MED ORDER — PRAVASTATIN SODIUM 40 MG PO TABS: 40.0000 mg | ORAL_TABLET | Freq: Every evening | ORAL | Status: DC

## 2011-07-25 MED ORDER — CARVEDILOL 3.125 MG PO TABS: 3.1250 mg | ORAL_TABLET | Freq: Two times a day (BID) | ORAL | Status: DC

## 2011-07-25 MED ORDER — NITROGLYCERIN 0.4 MG SL SUBL: 0.4000 mg | SUBLINGUAL_TABLET | SUBLINGUAL | Status: DC | PRN

## 2011-07-25 MED ORDER — CAPTOPRIL 25 MG PO TABS: 25.0000 mg | ORAL_TABLET | Freq: Two times a day (BID) | ORAL | Status: DC

## 2011-07-25 NOTE — Progress Notes (Signed)
Clinical Summary Erika Harrison is a 56 y.o.female presenting for office followup. She was seen by Ms. Lawrence in August 2012. Record review finds that she was admitted to the hospital back in January with chest pain, ruled out for MI, and was supposed to followup after that visit with [place], did not present at that time however.  She is here with her husband today. Generally she states that she has been feeling quite well. No chest pain or shortness of breath. She walks every day for 30 minutes, no exertional symptoms. She has had some very brief episodes of palpitations, only 3 in the last 3 or 4 months. No associated dizziness or syncope.  Lipid panel from September 2012 showed total cholesterol 134, triglycerides 48, HDL 58, LDL 66.  She reports compliance with her medications.   No Known Allergies  Current Outpatient Prescriptions  Medication Sig Dispense Refill  . aspirin 81 MG EC tablet Take 81 mg by mouth daily.        . Calcium Carbonate (CALTRATE 600) 1500 MG TABS Take 1 tablet by mouth daily.        . captopril (CAPOTEN) 25 MG tablet Take 1 tablet (25 mg total) by mouth 2 (two) times daily.  60 tablet  3  . carvedilol (COREG) 3.125 MG tablet Take 3.125 mg by mouth 2 (two) times daily with a meal.      . clonazePAM (KLONOPIN) 1 MG tablet Take 0.5-1 mg by mouth daily as needed.        . clopidogrel (PLAVIX) 75 MG tablet Take 1 tablet (75 mg total) by mouth daily.  90 tablet  3  . nitroGLYCERIN (NITROSTAT) 0.4 MG SL tablet Place 0.4 mg under the tongue every 5 (five) minutes as needed.        . Omega-3 Fatty Acids (FISH OIL) 1000 MG CAPS Take 1 capsule by mouth daily.        Marland Kitchen PARoxetine (PAXIL) 40 MG tablet Take 40 mg by mouth every morning.       . polycarbophil (FIBERCON) 625 MG tablet Take 625 mg by mouth daily.        . pravastatin (PRAVACHOL) 40 MG tablet Take 1 tablet (40 mg total) by mouth every evening.  30 tablet  0    Past Medical History  Diagnosis Date  . Essential  hypertension, benign   . Depression with anxiety   . Non-ST elevation MI (NSTEMI)     Ulcerated plaque mid LAD 2/12 - managed medically  . Coronary atherosclerosis of native coronary artery     Subtotally occluded distal LAD  . Mitral valve prolapse 1995    Past Surgical History  Procedure Date  . Laparoscopic abdominal surgery   . Right breast surgery     Family History  Problem Relation Age of Onset  . Cancer Father     Renal cell carcinoma  . Rheum arthritis Brother   . Hypertension Sister   . Drug abuse Brother     Social History Ms. Sheperd reports that she has never smoked. She has never used smokeless tobacco. Ms. Rossel reports that she does not drink alcohol.  Review of Systems Otherwise reviewed and negative.  Physical Examination Filed Vitals:   07/25/11 0857  BP: 109/67  Pulse: 68  Resp: 16   Normally nourished appearing woman in no acute distress. HEENT: Conjunctiva and lids normal, oropharynx clear with moist mucosa. Neck: Supple, no elevated JVP or carotid bruits, no thyromegaly. Lungs: Clear to  auscultation, nonlabored breathing at rest. Cardiac: Regular rate and rhythm, no S3 or significant systolic murmur, no pericardial rub. Abdomen: Soft, nontender, bowel sounds present, no guarding or rebound. Extremities: No pitting edema, distal pulses 2+. Skin: Warm and dry. Musculoskeletal: No kyphosis. Neuropsychiatric: Alert and oriented x3, affect grossly appropriate.   ECG Normal sinus rhythm.   Problem List and Plan   Coronary atherosclerosis of native coronary artery Symptomatically stable, no chest pain or shortness of breath. Continue regular exercise and diet. No changes to medications, with the exception of stopping Plavix at this point. She has been on it for over a year now, and had no intervention with her NSTEMI. She will stay on aspirin. Followup arranged.  Essential hypertension, benign Blood pressure well controlled  today.  Mixed hyperlipidemia She continues on Pravachol. Will arrange followup FLP and LFT.     Jonelle Sidle, M.D., F.A.C.C.

## 2011-07-25 NOTE — Assessment & Plan Note (Signed)
She continues on Pravachol. Will arrange followup FLP and LFT.

## 2011-07-25 NOTE — Assessment & Plan Note (Signed)
Symptomatically stable, no chest pain or shortness of breath. Continue regular exercise and diet. No changes to medications, with the exception of stopping Plavix at this point. She has been on it for over a year now, and had no intervention with her NSTEMI. She will stay on aspirin. Followup arranged.

## 2011-07-25 NOTE — Assessment & Plan Note (Signed)
Blood pressure well-controlled today. 

## 2011-07-25 NOTE — Patient Instructions (Signed)
**Note De-Identified Rito Lecomte Obfuscation** Your physician has recommended you make the following change in your medication: stop taking Plavix when you finish current bottle  Your physician recommends that you return for lab work in: By the end of next week (6-8)  Your physician recommends that you schedule a follow-up appointment in: 6 months

## 2011-08-01 LAB — LIPID PANEL
HDL: 57 mg/dL (ref >39)
LDL Cholesterol: 70 mg/dL (ref 0–99)
Triglycerides: 54 mg/dL (ref <150)

## 2011-08-01 LAB — HEPATIC FUNCTION PANEL
ALT: 16 U/L (ref 0–35)
Albumin: 4.4 g/dL (ref 3.5–5.2)
Total Protein: 6.3 g/dL (ref 6.0–8.3)

## 2011-11-19 ENCOUNTER — Other Ambulatory Visit: Payer: Self-pay | Admitting: *Deleted

## 2011-11-19 MED ORDER — CAPTOPRIL 25 MG PO TABS: 25.0000 mg | ORAL_TABLET | Freq: Two times a day (BID) | ORAL | Status: DC

## 2012-01-22 ENCOUNTER — Ambulatory Visit: Payer: Self-pay | Admitting: Cardiology

## 2012-02-03 ENCOUNTER — Encounter: Payer: Self-pay | Admitting: Cardiology

## 2012-02-03 ENCOUNTER — Ambulatory Visit (INDEPENDENT_AMBULATORY_CARE_PROVIDER_SITE_OTHER): Payer: Self-pay | Admitting: Cardiology

## 2012-02-03 VITALS — BP 110/76 | HR 66 | Ht 63.0 in | Wt 135.0 lb

## 2012-02-03 DIAGNOSIS — I2510 Atherosclerotic heart disease of native coronary artery without angina pectoris: Secondary | ICD-10-CM

## 2012-02-03 DIAGNOSIS — I10 Essential (primary) hypertension: Secondary | ICD-10-CM

## 2012-02-03 DIAGNOSIS — E782 Mixed hyperlipidemia: Secondary | ICD-10-CM

## 2012-02-03 NOTE — Assessment & Plan Note (Signed)
Symptomatically stable with history of plaque rupture in LAD and distal occlusion, managed medically. ECG is stable. Continue regular exercise, diet, medical therapy.

## 2012-02-03 NOTE — Assessment & Plan Note (Signed)
Blood pressure is normal today. 

## 2012-02-03 NOTE — Progress Notes (Signed)
Clinical Summary Ms. Lazard is a 56 y.o.female presenting for followup. She was seen in May. At that time Plavix was discontinued. She states that she exercises 7 days a week for at least 30 minutes. Enjoys walking for her activity. She has occasional epigastric discomfort, but no typical angina symptoms.  Lab work from June showed total cholesterol 138, triglycerides 54, HDL 57, LDL 70, normal LFTs. ECG today shows sinus rhythm with PRWP, low voltage, NSST changes.  She keeps interval follow up with primary care.  No Known Allergies  Current Outpatient Prescriptions  Medication Sig Dispense Refill  . aspirin 81 MG EC tablet Take 81 mg by mouth daily.        . Calcium Carbonate (CALTRATE 600) 1500 MG TABS Take 1 tablet by mouth daily.        . captopril (CAPOTEN) 25 MG tablet Take 1 tablet (25 mg total) by mouth 2 (two) times daily.  240 tablet  3  . carvedilol (COREG) 3.125 MG tablet Take 1 tablet (3.125 mg total) by mouth 2 (two) times daily with a meal.  240 tablet  3  . clonazePAM (KLONOPIN) 1 MG tablet Take 0.5-1 mg by mouth daily as needed.        . nitroGLYCERIN (NITROSTAT) 0.4 MG SL tablet Place 1 tablet (0.4 mg total) under the tongue every 5 (five) minutes as needed.  100 tablet  3  . Omega-3 Fatty Acids (FISH OIL) 1000 MG CAPS Take 1 capsule by mouth daily.        Marland Kitchen PARoxetine (PAXIL) 40 MG tablet Take 40 mg by mouth every morning.       . polycarbophil (FIBERCON) 625 MG tablet Take 625 mg by mouth daily.        . pravastatin (PRAVACHOL) 40 MG tablet Take 1 tablet (40 mg total) by mouth every evening.  90 tablet  3    Past Medical History  Diagnosis Date  . Essential hypertension, benign   . Depression with anxiety   . Non-ST elevation MI (NSTEMI)     Ulcerated plaque mid LAD 2/12 - managed medically  . Coronary atherosclerosis of native coronary artery     Subtotally occluded distal LAD  . Mitral valve prolapse 1995    Social History Ms. Beier reports that  she has never smoked. She has never used smokeless tobacco. Ms. Bresee reports that she does not drink alcohol.  Review of Systems No palpitations, no claudication. No orthopnea or PND. Otherwise negative.  Physical Examination Filed Vitals:   02/03/12 1439  BP: 110/76  Pulse: 66   Filed Weights   02/03/12 1439  Weight: 135 lb (61.236 kg)    Normally nourished appearing woman in no acute distress.  HEENT: Conjunctiva and lids normal, oropharynx clear with moist mucosa.  Neck: Supple, no elevated JVP or carotid bruits, no thyromegaly.  Lungs: Clear to auscultation, nonlabored breathing at rest.  Cardiac: Regular rate and rhythm, no S3 or significant systolic murmur, no pericardial rub.  Abdomen: Soft, nontender, bowel sounds present, no guarding or rebound.  Extremities: No pitting edema, distal pulses 2+.  Skin: Warm and dry.    Problem List and Plan   Coronary atherosclerosis of native coronary artery Symptomatically stable with history of plaque rupture in LAD and distal occlusion, managed medically. ECG is stable. Continue regular exercise, diet, medical therapy.  Essential hypertension, benign Blood pressure is normal today.  Mixed hyperlipidemia Continue current treatment with Pravachol.    Jonelle Sidle, M.D.,  F.A.C.C.

## 2012-02-03 NOTE — Patient Instructions (Addendum)
Your physician recommends that you schedule a follow-up appointment in: 6 months with SM 

## 2012-02-03 NOTE — Assessment & Plan Note (Signed)
Continue current treatment with Pravachol.

## 2012-04-11 ENCOUNTER — Other Ambulatory Visit: Payer: Self-pay

## 2012-07-16 ENCOUNTER — Telehealth: Payer: Self-pay | Admitting: *Deleted

## 2012-07-16 NOTE — Telephone Encounter (Signed)
Please advise if ok to change, f/u noted for 08-25-12

## 2012-07-16 NOTE — Telephone Encounter (Signed)
PT TAKES CAPTOPRIL AND HAS FOR YEARS BUT INSURANCE HAS CHANGED AND IT COST TO MUCH SHE WOULD LIKE TO BE PUT ON LISINOPRIL.  PLUS SHE NEEDS PRAVASTATIN CALLED IN TO [facility] IN [place]

## 2012-07-17 MED ORDER — PRAVASTATIN SODIUM 40 MG PO TABS: 40.0000 mg | ORAL_TABLET | Freq: Every evening | ORAL | Status: DC

## 2012-07-17 MED ORDER — LISINOPRIL 5 MG PO TABS: 5.0000 mg | ORAL_TABLET | Freq: Every day | ORAL | Status: DC

## 2012-07-17 NOTE — Telephone Encounter (Signed)
Yes can switch to lisinopril. General conversion dose would be lisinopril 5 mg daily, although we may need to adjust this depending on her blood pressure control. Okay to: Pravastatin as well.

## 2012-07-17 NOTE — Telephone Encounter (Signed)
Spoke to Erika Harrison to advise results/instructions. Erika Harrison understood. rx sent to pharmacy by e-script Erika Harrison states she has an apt on 08-25-12 and just purchased the Captopril and wants to finish taking for the next month, then she will start on the lisinopril Advised once she starts taking the lisinopril Your physician has requested that you regularly monitor and record your blood pressure readings at home. Please use the same machine at the same time of day to check your readings and record them to bring to your follow-up visit. Call office is elevations above 140/90 occur consecutively to make possible adjustments, Erika Harrison understood all instructions, pravastatin sent as well

## 2012-08-25 ENCOUNTER — Encounter: Payer: Self-pay | Admitting: Cardiology

## 2012-08-25 ENCOUNTER — Ambulatory Visit (INDEPENDENT_AMBULATORY_CARE_PROVIDER_SITE_OTHER): Payer: Self-pay | Admitting: Cardiology

## 2012-08-25 VITALS — BP 120/88 | HR 62 | Ht 63.0 in | Wt 133.1 lb

## 2012-08-25 DIAGNOSIS — E782 Mixed hyperlipidemia: Secondary | ICD-10-CM

## 2012-08-25 DIAGNOSIS — I10 Essential (primary) hypertension: Secondary | ICD-10-CM

## 2012-08-25 DIAGNOSIS — I2510 Atherosclerotic heart disease of native coronary artery without angina pectoris: Secondary | ICD-10-CM

## 2012-08-25 NOTE — Assessment & Plan Note (Signed)
Keep followup with Dr. McGough. 

## 2012-08-25 NOTE — Assessment & Plan Note (Signed)
Symptomatically stable, ECG reviewed. No changes made to current regimen. Encouraged continued regular exercise regimen. Followup arranged.

## 2012-08-25 NOTE — Patient Instructions (Addendum)
Your physician recommends that you schedule a follow-up appointment in: 6 months  Your physician has requested that you regularly monitor and record your blood pressure readings at home. Please use the same machine at the same time of day to check your readings and record them to bring to your follow-up visit. CALL OFFICE IF ELEVATED OVER A CONSECUTIVE AMOUNT OF DAYS (3-5) [phone]

## 2012-08-25 NOTE — Assessment & Plan Note (Signed)
Blood pressure control is reasonable today. She continues to check this at home. If trend increases, we may need to adjust dose of lisinopril.

## 2012-08-25 NOTE — Progress Notes (Signed)
Clinical Summary [name] is a 57 y.o.female last seen in December 2013. She is here with her daughter, overall has been doing well. She states that she has been under stress, her mother passed away approximately one month ago. Some increase in palpitations with this, although no chest pain symptoms. She continues to walk regularly. Medications are reviewed. She has been switched from captopril to lisinopril for cost savings. Blood pressure is reasonable today. ECG shows sinus rhythm with decreased voltage.   No Known Allergies  Current Outpatient Prescriptions  Medication Sig Dispense Refill  . aspirin 81 MG EC tablet Take 81 mg by mouth daily.        . Calcium Carbonate (CALTRATE 600) 1500 MG TABS Take 1 tablet by mouth daily.        . carvedilol (COREG) 3.125 MG tablet Take 1 tablet (3.125 mg total) by mouth 2 (two) times daily with a meal.  240 tablet  3  . clonazePAM (KLONOPIN) 1 MG tablet Take 1 mg by mouth 3 (three) times daily as needed.       Marland Kitchen lisinopril (PRINIVIL,ZESTRIL) 5 MG tablet Take 1 tablet (5 mg total) by mouth daily.  90 tablet  1  . nitroGLYCERIN (NITROSTAT) 0.4 MG SL tablet Place 1 tablet (0.4 mg total) under the tongue every 5 (five) minutes as needed.  100 tablet  3  . Omega-3 Fatty Acids (FISH OIL) 1000 MG CAPS Take 1 capsule by mouth daily.        Marland Kitchen PARoxetine (PAXIL) 20 MG tablet Take 40 mg by mouth daily.      . polycarbophil (FIBERCON) 625 MG tablet Take 625 mg by mouth daily.        . pravastatin (PRAVACHOL) 40 MG tablet Take 1 tablet (40 mg total) by mouth every evening.  90 tablet  1   No current facility-administered medications for this visit.    Past Medical History  Diagnosis Date  . Essential hypertension, benign   . Depression with anxiety   . Non-ST elevation MI (NSTEMI)     Ulcerated plaque mid LAD 2/12 - managed medically  . Coronary atherosclerosis of native coronary artery     Subtotally occluded distal LAD  . Mitral valve prolapse  1995    Social History [name] reports that she has never smoked. She has never used smokeless tobacco. [name] reports that she does not drink alcohol.  Review of Systems Occasional brief dizziness, no syncope. Stable appetite. Otherwise negative.  Physical Examination Filed Vitals:   08/25/12 1412  BP: 120/88  Pulse: 62   Filed Weights   08/25/12 1412  Weight: 133 lb 1.9 oz (60.383 kg)    Normally nourished appearing woman, comfortable at rest..  HEENT: Conjunctiva and lids normal, oropharynx clear with moist mucosa.  Neck: Supple, no elevated JVP or carotid bruits, no thyromegaly.  Lungs: Clear to auscultation, nonlabored breathing at rest.  Cardiac: Regular rate and rhythm, no S3 or significant systolic murmur, no pericardial rub.  Abdomen: Soft, nontender, bowel sounds present, no guarding or rebound.  Extremities: No pitting edema, distal pulses 2+.  Skin: Warm and dry.   Problem List and Plan   Coronary atherosclerosis of native coronary artery Symptomatically stable, ECG reviewed. No changes made to current regimen. Encouraged continued regular exercise regimen. Followup arranged.  Essential hypertension, benign Blood pressure control is reasonable today. She continues to check this at home. If trend increases, we may need to adjust dose of lisinopril.  Mixed  hyperlipidemia Keep followup with Dr. Regino Schultze.    Jonelle Sidle, M.D., F.A.C.C.

## 2012-09-25 ENCOUNTER — Observation Stay (HOSPITAL_COMMUNITY)
Admission: EM | Admit: 2012-09-25 | Discharge: 2012-09-26 | Disposition: A | Discharge status: Home / Self Care | Payer: Self-pay | Attending: Internal Medicine | Admitting: Internal Medicine

## 2012-09-25 ENCOUNTER — Encounter (HOSPITAL_COMMUNITY): Payer: Self-pay

## 2012-09-25 ENCOUNTER — Emergency Department (HOSPITAL_COMMUNITY): Payer: Self-pay

## 2012-09-25 DIAGNOSIS — E782 Mixed hyperlipidemia: Secondary | ICD-10-CM | POA: Diagnosis present

## 2012-09-25 DIAGNOSIS — I10 Essential (primary) hypertension: Secondary | ICD-10-CM | POA: Diagnosis present

## 2012-09-25 DIAGNOSIS — F419 Anxiety disorder, unspecified: Secondary | ICD-10-CM

## 2012-09-25 DIAGNOSIS — F411 Generalized anxiety disorder: Secondary | ICD-10-CM | POA: Insufficient documentation

## 2012-09-25 DIAGNOSIS — I2510 Atherosclerotic heart disease of native coronary artery without angina pectoris: Secondary | ICD-10-CM | POA: Diagnosis present

## 2012-09-25 DIAGNOSIS — R079 Chest pain, unspecified: Principal | ICD-10-CM

## 2012-09-25 LAB — CBC WITH DIFFERENTIAL/PLATELET
Basophils Absolute: 0 10*3/uL (ref 0.0–0.1)
Basophils Relative: 1 % (ref 0–1)
Eosinophils Absolute: 0.1 10*3/uL (ref 0.0–0.7)
Eosinophils Relative: 2 % (ref 0–5)
HCT: 40.2 % (ref 36.0–46.0)
Hemoglobin: 13.9 g/dL (ref 12.0–15.0)
Lymphocytes Relative: 48 % — ABNORMAL HIGH (ref 12–46)
Lymphs Abs: 2.2 10*3/uL (ref 0.7–4.0)
MCH: 31 pg (ref 26.0–34.0)
MCHC: 34.6 g/dL (ref 30.0–36.0)
MCV: 89.5 fL (ref 78.0–100.0)
Monocytes Absolute: 0.4 10*3/uL (ref 0.1–1.0)
Monocytes Relative: 9 % (ref 3–12)
Neutro Abs: 1.8 10*3/uL (ref 1.7–7.7)
Neutrophils Relative %: 40 % — ABNORMAL LOW (ref 43–77)
Platelets: 215 10*3/uL (ref 150–400)
RBC: 4.49 MIL/uL (ref 3.87–5.11)
RDW: 11.8 % (ref 11.5–15.5)
WBC: 4.6 10*3/uL (ref 4.0–10.5)

## 2012-09-25 LAB — BASIC METABOLIC PANEL
BUN: 20 mg/dL (ref 6–23)
CO2: 31 mEq/L (ref 19–32)
Calcium: 10.1 mg/dL (ref 8.4–10.5)
Chloride: 100 mEq/L (ref 96–112)
Creatinine, Ser: 0.67 mg/dL (ref 0.50–1.10)
GFR calc Af Amer: >90 mL/min (ref >90)
GFR calc non Af Amer: >90 mL/min (ref >90)
Glucose, Bld: 107 mg/dL — ABNORMAL HIGH (ref 70–99)
Potassium: 3.5 mEq/L (ref 3.5–5.1)
Sodium: 140 mEq/L (ref 135–145)

## 2012-09-25 LAB — TROPONIN I: Troponin I: <0.3 ng/mL (ref <0.30)

## 2012-09-25 MED ORDER — ASPIRIN 81 MG PO CHEW
243.0000 mg | CHEWABLE_TABLET | Freq: Once | ORAL | Status: AC
  Administered 2012-09-25: 243 mg via ORAL
  Filled 2012-09-25: qty 3

## 2012-09-25 MED ORDER — CLONAZEPAM 0.5 MG PO TABS
1.0000 mg | ORAL_TABLET | Freq: Three times a day (TID) | ORAL | Status: DC | PRN
  Administered 2012-09-25: 1 mg via ORAL
  Filled 2012-09-25: qty 2

## 2012-09-25 MED ORDER — MORPHINE SULFATE 2 MG/ML IJ SOLN: 1.0000 mg | INTRAMUSCULAR | Status: DC | PRN

## 2012-09-25 MED ORDER — SODIUM CHLORIDE 0.9 % IV SOLN: INTRAVENOUS | Status: DC

## 2012-09-25 MED ORDER — PAROXETINE HCL 20 MG PO TABS
40.0000 mg | ORAL_TABLET | Freq: Every day | ORAL | Status: DC
  Administered 2012-09-26: 40 mg via ORAL
  Filled 2012-09-25: qty 2

## 2012-09-25 MED ORDER — NITROGLYCERIN 0.4 MG SL SUBL: 0.4000 mg | SUBLINGUAL_TABLET | SUBLINGUAL | Status: DC | PRN

## 2012-09-25 MED ORDER — SIMVASTATIN 20 MG PO TABS: 20.0000 mg | ORAL_TABLET | Freq: Every day | ORAL | Status: DC

## 2012-09-25 MED ORDER — OMEGA-3-ACID ETHYL ESTERS 1 G PO CAPS
1.0000 g | ORAL_CAPSULE | Freq: Two times a day (BID) | ORAL | Status: DC
  Administered 2012-09-25 – 2012-09-26 (×2): 1 g via ORAL
  Filled 2012-09-25 (×2): qty 1

## 2012-09-25 MED ORDER — ENOXAPARIN SODIUM 40 MG/0.4ML ~~LOC~~ SOLN
40.0000 mg | SUBCUTANEOUS | Status: DC
  Administered 2012-09-26: 40 mg via SUBCUTANEOUS
  Filled 2012-09-25: qty 0.4

## 2012-09-25 MED ORDER — CARVEDILOL 3.125 MG PO TABS
3.1250 mg | ORAL_TABLET | Freq: Two times a day (BID) | ORAL | Status: DC
  Administered 2012-09-25 – 2012-09-26 (×2): 3.125 mg via ORAL
  Filled 2012-09-25 (×2): qty 1

## 2012-09-25 MED ORDER — ASPIRIN EC 81 MG PO TBEC
81.0000 mg | DELAYED_RELEASE_TABLET | Freq: Every day | ORAL | Status: DC
  Administered 2012-09-26: 81 mg via ORAL
  Filled 2012-09-25 (×4): qty 1

## 2012-09-25 MED ORDER — LISINOPRIL 5 MG PO TABS
5.0000 mg | ORAL_TABLET | Freq: Every day | ORAL | Status: DC
  Administered 2012-09-26: 5 mg via ORAL
  Filled 2012-09-25: qty 1

## 2012-09-25 NOTE — ED Notes (Signed)
Pt reports hx of MI in 2012, was placed on anti-coagulant medication, no craterization required, pt is not currently on blood thinners, pt does take 81mg  asa per day, pt took 81mg  asa 3 hours ago when chest pain first started. Pt rates pain 4/10.

## 2012-09-25 NOTE — ED Notes (Signed)
Pt c/o mid sternal chest pain onset 3 hours ago, states pain also radiates around under her left breast.

## 2012-09-25 NOTE — ED Provider Notes (Signed)
CSN: [id number]     Arrival date & time 09/25/12  2014 History  This chart was scribed for Erika Razor, MD by Bennett Scrape, ED Scribe. This patient was seen in room APA02/[facility]02 and the patient's care was started at 8:43 PM.   First MD Initiated Contact with Patient 09/25/12 2021     Chief Complaint  Patient presents with  . Chest Pain    The history is provided by the patient. No language interpreter was used.    HPI Comments: Erika Harrison is a 57 y.o. female who presents to the Emergency Department complaining of left CP that started 3 hours ago. She describes the pain as a worsening, non-radiating, burning type pressure. She denies doing any exertion or strenuous activity at the time the pain started. She denies having any modifying factors including movement, deep breathing or certain positions. She reports that she took one 81 mg ASA after the onset with no improvement.  She has a h/o a prior MI and reports similarities. She reports that she has experienced gas before but denies similarities. She states that she had her last cardiac cath in Feb 2012 cath after her last MI. She denies having a prior or recently performed stress test. She denies SOB, fevers, chills, cough, diaphoresis and nausea as associated symptoms. She admits that she has taken her daily medications as prescribed. She reports having pain in the inner left arm and left thigh described as an "inner burning sensation" over the past 6 months. She states that she has not followed up due to lack of insurance. She also reports palpitations described as a "fast fluttering" with lightheadedness afterwards. She told her Cardiologist about the palpations and was advised to be evaluated if the symptoms continued.   Cardiologist is Dr. Diona Browner, last visit was for a routine visit one month ago   Past Medical History  Diagnosis Date  . Essential hypertension, benign   . Depression with anxiety   . Non-ST elevation MI  (NSTEMI)     Ulcerated plaque mid LAD 2/12 - managed medically  . Coronary atherosclerosis of native coronary artery     Subtotally occluded distal LAD  . Mitral valve prolapse 1995   Past Surgical History  Procedure Laterality Date  . Laparoscopic abdominal surgery    . Right breast surgery     Family History  Problem Relation Age of Onset  . Cancer Father     Renal cell carcinoma  . Rheum arthritis Brother   . Hypertension Sister   . Drug abuse Brother    History  Substance Use Topics  . Smoking status: Never Smoker   . Smokeless tobacco: Never Used  . Alcohol Use: No   No OB history provided.  Review of Systems  Constitutional: Negative for fever and chills.  Respiratory: Negative for shortness of breath.   Cardiovascular: Positive for chest pain and palpitations. Negative for leg swelling.  Gastrointestinal: Negative for nausea and vomiting.  Musculoskeletal: Positive for myalgias.  Neurological: Positive for light-headedness (with palpitation episodes ).  All other systems reviewed and are negative.    Allergies  Review of patient's allergies indicates no known allergies.  Home Medications   Current Outpatient Rx  Name  Route  Sig  Dispense  Refill  . aspirin 81 MG EC tablet   Oral   Take 81 mg by mouth daily.           . Calcium Carbonate (CALTRATE 600) 1500 MG TABS   Oral  Take 1 tablet by mouth daily.           . carvedilol (COREG) 3.125 MG tablet   Oral   Take 1 tablet (3.125 mg total) by mouth 2 (two) times daily with a meal.   240 tablet   3   . clonazePAM (KLONOPIN) 1 MG tablet   Oral   Take 1 mg by mouth 3 (three) times daily as needed.          Marland Kitchen lisinopril (PRINIVIL,ZESTRIL) 5 MG tablet   Oral   Take 1 tablet (5 mg total) by mouth daily.   90 tablet   1   . nitroGLYCERIN (NITROSTAT) 0.4 MG SL tablet   Sublingual   Place 1 tablet (0.4 mg total) under the tongue every 5 (five) minutes as needed.   100 tablet   3   .  Omega-3 Fatty Acids (FISH OIL) 1000 MG CAPS   Oral   Take 1 capsule by mouth daily.           Marland Kitchen PARoxetine (PAXIL) 20 MG tablet   Oral   Take 40 mg by mouth daily.         . polycarbophil (FIBERCON) 625 MG tablet   Oral   Take 625 mg by mouth daily.           . pravastatin (PRAVACHOL) 40 MG tablet   Oral   Take 1 tablet (40 mg total) by mouth every evening.   90 tablet   1    Triage Vitals: BP 148/77  Temp(Src) 98.3 F (36.8 C) (Oral)  Resp 20  Ht 5\' 3"  (1.6 m)  Wt 132 lb (59.875 kg)  BMI 23.39 kg/m2  SpO2 99%  LMP 12/27/2010  Physical Exam  Nursing note and vitals reviewed. Constitutional: She is oriented to person, place, and time. She appears well-developed and well-nourished. No distress.  HENT:  Head: Normocephalic and atraumatic.  Eyes: Conjunctivae and EOM are normal.  Neck: Normal range of motion. Neck supple. No tracheal deviation present.  Cardiovascular: Regular rhythm and normal heart sounds.  Bradycardia present.   No murmur heard. Mildly bradycardic     Pulmonary/Chest: Effort normal and breath sounds normal. No respiratory distress. She has no wheezes. She has no rales. She exhibits no tenderness (non-reporducible).  Abdominal: Soft. Bowel sounds are normal. There is no tenderness.  Musculoskeletal: Normal range of motion. She exhibits no edema.  Neurological: She is alert and oriented to person, place, and time. No cranial nerve deficit.  Skin: Skin is warm and dry.  Psychiatric: Her behavior is normal. Her mood appears anxious.    ED Course   Procedures (including critical care time)  Medications  aspirin chewable tablet 243 mg (not administered)    DIAGNOSTIC STUDIES: Oxygen Saturation is 99% on room air, normal by my interpretation.    COORDINATION OF CARE: 8:50 PM-Discussed treatment plan which includes ASA, CXR, CBC panel, BMP and troponin with pt at bedside and pt agreed to plan.  10:20 PM-Consult complete with Dr. Sharl Ma,  hospitalist. Patient case explained and discussed. Dr. Sharl Ma agrees to admit patient for further evaluation and treatment. Call ended at 10:22 PM.  Labs Reviewed  CBC WITH DIFFERENTIAL - Abnormal; Notable for the following:    Neutrophils Relative % 40 (*)    Lymphocytes Relative 48 (*)    All other components within normal limits  BASIC METABOLIC PANEL - Abnormal; Notable for the following:    Glucose, Bld 107 (*)  All other components within normal limits  TROPONIN I   Dg Chest Portable 1 View  09/25/2012   *RADIOLOGY REPORT*  Clinical Data: Chest pain  PORTABLE CHEST - 1 VIEW  Comparison: 02/26/2011  Findings: Lungs are clear. No pleural effusion or pneumothorax.  Mild elevation of the hemidiaphragm.  The heart is normal in size.  IMPRESSION: No evidence of acute cardiopulmonary disease.   Original Report Authenticated By: Charline Bills, M.D.  EKG:  Rhythm: sinus bradycardia Vent. rate 58 BPM PR interval 170 ms QRS duration 90 ms QT/QTc 424/416 ms ST segments: NS ST changes   1. Chest pain   2. Coronary atherosclerosis of native coronary artery   3. Essential hypertension, benign   4. Mixed hyperlipidemia   5. Anxiety disorder     MDM  56yF with CP. Now resolved.  EKG with no particularly concerning findings or much change from previous. Initial troponin normal. With onset of symptoms shortly before arrival and hx of known CAD, will admit for r/o.    I personally preformed the services scribed in my presence. The recorded information has been reviewed is accurate. Erika Razor, MD.    Erika Razor, MD 09/29/12 [phone]

## 2012-09-25 NOTE — H&P (Signed)
PCP:   Lenise Herald, PA-C   Chief Complaint:  Chest pain  HPI: 57 year old female with a history of non-STEMI , hypertension, hyperlipidemia, anxiety and depression came to the hospital with chest pain but started at 5 PM today. Pain was in the left side under the left breast, did not have shortness of breath. Patient does have history of anxiety disorder and says that she was very stressed. Patient denies nausea vomiting. The chest pain resolved after patient came to the ED at this time patient is chest pain-free.  Allergies:  No Known Allergies    Past Medical History  Diagnosis Date  . Essential hypertension, benign   . Depression with anxiety   . Non-ST elevation MI (NSTEMI)     Ulcerated plaque mid LAD 2/12 - managed medically  . Coronary atherosclerosis of native coronary artery     Subtotally occluded distal LAD  . Mitral valve prolapse 1995    Past Surgical History  Procedure Laterality Date  . Laparoscopic abdominal surgery    . Right breast surgery      Prior to Admission medications   Medication Sig Start Date End Date Taking? Authorizing Provider  aspirin 81 MG EC tablet Take 81 mg by mouth daily.     Yes Historical Provider, MD  Calcium Carbonate (CALTRATE 600) 1500 MG TABS Take 1 tablet by mouth daily.     Yes Historical Provider, MD  carvedilol (COREG) 3.125 MG tablet Take 1 tablet (3.125 mg total) by mouth 2 (two) times daily with a meal. 07/25/11  Yes Jonelle Sidle, MD  clonazePAM (KLONOPIN) 1 MG tablet Take 1 mg by mouth 3 (three) times daily as needed.    Yes Historical Provider, MD  lisinopril (PRINIVIL,ZESTRIL) 5 MG tablet Take 1 tablet (5 mg total) by mouth daily. 07/17/12  Yes Jonelle Sidle, MD  PARoxetine (PAXIL) 20 MG tablet Take 40 mg by mouth daily.   Yes Historical Provider, MD  polycarbophil (FIBERCON) 625 MG tablet Take 625 mg by mouth daily.     Yes Historical Provider, MD  pravastatin (PRAVACHOL) 40 MG tablet Take 1 tablet (40 mg total) by  mouth every evening. 07/17/12 07/17/13 Yes Jonelle Sidle, MD  nitroGLYCERIN (NITROSTAT) 0.4 MG SL tablet Place 1 tablet (0.4 mg total) under the tongue every 5 (five) minutes as needed. 07/25/11   Jonelle Sidle, MD  Omega-3 Fatty Acids (FISH OIL) 1000 MG CAPS Take 1 capsule by mouth daily.      Historical Provider, MD    Social History:  reports that she has never smoked. She has never used smokeless tobacco. She reports that she does not drink alcohol or use illicit drugs.  Family History  Problem Relation Age of Onset  . Cancer Father     Renal cell carcinoma  . Rheum arthritis Brother   . Hypertension Sister   . Drug abuse Brother      All the positives are listed in BOLD  Review of Systems:  HEENT: Headache, blurred vision, runny nose, sore throat Neck: Hypothyroidism, hyperthyroidism,,lymphadenopathy Chest : Shortness of breath, history of COPD, Asthma Heart : Chest pain, history of coronary arterey disease GI:  Nausea, vomiting, diarrhea, constipation, GERD GU: Dysuria, urgency, frequency of urination, hematuria Neuro: Stroke, seizures, syncope Psych: Depression, anxiety, hallucinations   Physical Exam: Blood pressure 129/66, pulse 56, temperature 98.3 F (36.8 C), temperature source Oral, resp. rate 14, height 5\' 3"  (1.6 m), weight 59.875 kg (132 lb), last menstrual period 12/27/2010, SpO2  97.00%. Constitutional:   Patient is a well-developed and well-nourished *female in no acute distress and cooperative with exam. Head: Normocephalic and atraumatic Mouth: Mucus membranes moist Eyes: PERRL, EOMI, conjunctivae normal Neck: Supple, No Thyromegaly Cardiovascular: RRR, S1 normal, S2 normal Pulmonary/Chest: CTAB, no wheezes, rales, or rhonchi. No chest wall tenderness on palpation of left-sided chest wall under the left breast. Abdominal: Soft. Non-tender, non-distended, bowel sounds are normal, no masses, organomegaly, or guarding present.  Neurological: A&O x3,  Strenght is normal and symmetric bilaterally, cranial nerve II-XII are grossly intact, no focal motor deficit, sensory intact to light touch bilaterally.  Extremities : No Cyanosis, Clubbing or Edema   Labs on Admission:  Results for orders placed during the hospital encounter of 09/25/12 (from the past 48 hour(s))  CBC WITH DIFFERENTIAL     Status: Abnormal   Collection Time    09/25/12  8:28 PM      Result Value Range   WBC 4.6  4.0 - 10.5 K/uL   RBC 4.49  3.87 - 5.11 MIL/uL   Hemoglobin 13.9  12.0 - 15.0 g/dL   HCT 16.1  09.6 - 04.5 %   MCV 89.5  78.0 - 100.0 fL   MCH 31.0  26.0 - 34.0 pg   MCHC 34.6  30.0 - 36.0 g/dL   RDW 40.9  81.1 - 91.4 %   Platelets 215  150 - 400 K/uL   Neutrophils Relative % 40 (*) 43 - 77 %   Neutro Abs 1.8  1.7 - 7.7 K/uL   Lymphocytes Relative 48 (*) 12 - 46 %   Lymphs Abs 2.2  0.7 - 4.0 K/uL   Monocytes Relative 9  3 - 12 %   Monocytes Absolute 0.4  0.1 - 1.0 K/uL   Eosinophils Relative 2  0 - 5 %   Eosinophils Absolute 0.1  0.0 - 0.7 K/uL   Basophils Relative 1  0 - 1 %   Basophils Absolute 0.0  0.0 - 0.1 K/uL  BASIC METABOLIC PANEL     Status: Abnormal   Collection Time    09/25/12  8:28 PM      Result Value Range   Sodium 140  135 - 145 mEq/L   Potassium 3.5  3.5 - 5.1 mEq/L   Chloride 100  96 - 112 mEq/L   CO2 31  19 - 32 mEq/L   Glucose, Bld 107 (*) 70 - 99 mg/dL   BUN 20  6 - 23 mg/dL   Creatinine, Ser 7.82  0.50 - 1.10 mg/dL   Calcium 95.6  8.4 - 21.3 mg/dL   GFR calc non Af Amer >90  >90 mL/min   GFR calc Af Amer >90  >90 mL/min   Comment:            The eGFR has been calculated     using the CKD EPI equation.     This calculation has not been     validated in all clinical     situations.     eGFR's persistently     <90 mL/min signify     possible Chronic Kidney Disease.  TROPONIN I     Status: None   Collection Time    09/25/12  8:28 PM      Result Value Range   Troponin I <0.30  <0.30 ng/mL   Comment:            Due  to the release kinetics of cTnI,  a negative result within the first hours     of the onset of symptoms does not rule out     myocardial infarction with certainty.     If myocardial infarction is still suspected,     repeat the test at appropriate intervals.    Radiological Exams on Admission: Dg Chest Portable 1 View  09/25/2012   *RADIOLOGY REPORT*  Clinical Data: Chest pain  PORTABLE CHEST - 1 VIEW  Comparison: 02/26/2011  Findings: Lungs are clear. No pleural effusion or pneumothorax.  Mild elevation of the hemidiaphragm.  The heart is normal in size.  IMPRESSION: No evidence of acute cardiopulmonary disease.   Original Report Authenticated By: Charline Bills, M.D.    Assessment/Plan Active Problems:   Mixed hyperlipidemia   Coronary atherosclerosis of native coronary artery   Essential hypertension, benign   Chest pain   Anxiety disorder  Chest pain Patient has history of non-STEMI, will admit to rule out non-STEMI. Facet of cardiac enzymes is negative. EKG shows normal sinus rhythm. Will admit her  to telemetry We'll obtain 3 sets of cardiac enzymes  Hypertension Continue lisinopril and Coreg  Anxiety disorder Continue clonazepam and Paxil  Hyperlipidemia Continue statin  DVT prophylaxis Lovenox  Code status: Full code  Family discussion: Discussed with patient in detail   Time Spent on Admission: 65 min  Rosio Weiss S [facility] Pager: [phone] 09/25/2012, 10:42 PM  If 7PM-7AM, please contact night-coverage  [email or web address]  Password [email or web address]

## 2012-09-26 DIAGNOSIS — F411 Generalized anxiety disorder: Secondary | ICD-10-CM

## 2012-09-26 LAB — COMPREHENSIVE METABOLIC PANEL
AST: 20 U/L (ref 0–37)
Albumin: 3.8 g/dL (ref 3.5–5.2)
BUN: 17 mg/dL (ref 6–23)
Creatinine, Ser: 0.66 mg/dL (ref 0.50–1.10)
Potassium: 3.5 mEq/L (ref 3.5–5.1)
Total Protein: 6.4 g/dL (ref 6.0–8.3)

## 2012-09-26 LAB — CBC
HCT: 36.7 % (ref 36.0–46.0)
MCHC: 34.6 g/dL (ref 30.0–36.0)
Platelets: 188 10*3/uL (ref 150–400)
RDW: 11.8 % (ref 11.5–15.5)
WBC: 3.5 10*3/uL — ABNORMAL LOW (ref 4.0–10.5)

## 2012-09-26 LAB — TROPONIN I
Troponin I: <0.3 ng/mL (ref <0.30)
Troponin I: <0.3 ng/mL (ref <0.30)

## 2012-09-26 MED ORDER — OMEPRAZOLE MAGNESIUM 20 MG PO TBEC: 20.0000 mg | DELAYED_RELEASE_TABLET | Freq: Every day | ORAL | Status: DC

## 2012-09-26 NOTE — Progress Notes (Signed)
Pt. Discharged home.  IV was removed WNL.  Education provided on chest pain, angina, prilosec, and heart healthy diet.  Pt. Instructed to make follow-up appointment.  Pt. Has no questions at this time.  Pt. Left in stable condition via wheelchair by nurse tech.

## 2012-09-26 NOTE — Discharge Summary (Signed)
Physician Discharge Summary  Erika Harrison ZOX:[id number] DOB: [birth year] DOA: 09/25/2012  PCP: Lenise Herald, PA-C Cardiologist: Dr. Diona Browner  Admit date: 09/25/2012 Discharge date: 09/26/2012  Time spent: 25 minutes  Recommendations for Outpatient Follow-up:  1. Follow up with Cardiology in 1-2 weeks  Discharge Diagnoses:  Active Problems:   Mixed hyperlipidemia   Coronary atherosclerosis of native coronary artery   Essential hypertension, benign   Chest pain   Anxiety disorder   Discharge Condition: improved  Diet recommendation: low salt  Filed Weights   09/25/12 2021 09/25/12 2330  Weight: 59.875 kg (132 lb) 61.8 kg (136 lb 3.9 oz)    History of present illness:  57 year old female with a history of non-STEMI , hypertension, hyperlipidemia, anxiety and depression came to the hospital with chest pain but started at 5 PM today. Pain was in the left side under the left breast, did not have shortness of breath. Patient does have history of anxiety disorder and says that she was very stressed. Patient denies nausea vomiting. The chest pain resolved after patient came to the ED at this time patient is chest pain-free.  Hospital Course:  This lady was admitted to the hospital with chest pain. She reports onset of symptoms occurring after she did her. They last for approximately one hour and resolve on their on. She's not had any recurrence of her symptoms. She feels it may been related to "gas". She did not have any EKG changes and cardiac enzymes were found to be negative. She feels back to baseline. She'll be prescribed protonic for any GI upset related to her symptoms. She's been asked to followup with cardiology in the next 1-2 weeks. She is felt stable for discharge home.  Procedures:  none  Consultations:  none  Discharge Exam: Filed Vitals:   09/25/12 2200 09/25/12 2230 09/25/12 2330 09/26/12 0919  BP: 123/75 129/66 136/82 106/68  Pulse: 53 56 43 63  Temp:   97.9  F (36.6 C) 98.2 F (36.8 C)  TempSrc:   Oral Oral  Resp: 15 14 16 14   Height:      Weight:   61.8 kg (136 lb 3.9 oz)   SpO2: 98% 97% 98% 96%    General: NAD Cardiovascular: s1, s2, rrr Respiratory: cta b  Discharge Instructions  Discharge Orders   Future Orders Complete By Expires     Call MD for:  difficulty breathing, headache or visual disturbances  As directed     Call MD for:  severe uncontrolled pain  As directed     Diet - low sodium heart healthy  As directed     Increase activity slowly  As directed         Medication List         aspirin 81 MG EC tablet  Take 81 mg by mouth daily.     CALTRATE 600 1500 MG Tabs  Generic drug:  Calcium Carbonate  Take 1 tablet by mouth daily.     carvedilol 3.125 MG tablet  Commonly known as:  COREG  Take 1 tablet (3.125 mg total) by mouth 2 (two) times daily with a meal.     clonazePAM 1 MG tablet  Commonly known as:  KLONOPIN  Take 1 mg by mouth 3 (three) times daily as needed.     Fish Oil 1000 MG Caps  Take 1 capsule by mouth daily.     lisinopril 5 MG tablet  Commonly known as:  PRINIVIL,ZESTRIL  Take 1 tablet (5  mg total) by mouth daily.     nitroGLYCERIN 0.4 MG SL tablet  Commonly known as:  NITROSTAT  Place 1 tablet (0.4 mg total) under the tongue every 5 (five) minutes as needed.     omeprazole 20 MG tablet  Commonly known as:  PRILOSEC OTC  Take 1 tablet (20 mg total) by mouth daily.     PARoxetine 20 MG tablet  Commonly known as:  PAXIL  Take 40 mg by mouth daily.     polycarbophil 625 MG tablet  Commonly known as:  FIBERCON  Take 625 mg by mouth daily.     pravastatin 40 MG tablet  Commonly known as:  PRAVACHOL  Take 1 tablet (40 mg total) by mouth every evening.       No Known Allergies     Follow-up Information   Follow up with MANN, BENJAMIN, PA-C. (as scheduled)    Contact information:   [address] [place] [address]       Follow up with Nona Dell, MD.    Contact information:   [address] [place] [address] [phone]        The results of significant diagnostics from this hospitalization (including imaging, microbiology, ancillary and laboratory) are listed below for reference.    Significant Diagnostic Studies: Dg Chest Portable 1 View  09/25/2012   *RADIOLOGY REPORT*  Clinical Data: Chest pain  PORTABLE CHEST - 1 VIEW  Comparison: 02/26/2011  Findings: Lungs are clear. No pleural effusion or pneumothorax.  Mild elevation of the hemidiaphragm.  The heart is normal in size.  IMPRESSION: No evidence of acute cardiopulmonary disease.   Original Report Authenticated By: Charline Bills, M.D.    Microbiology: No results found for this or any previous visit (from the past 240 hour(s)).   Labs: Basic Metabolic Panel:  Recent Labs Lab 09/25/12 2028 09/26/12 0245  NA 140 138  K 3.5 3.5  CL 100 101  CO2 31 30  GLUCOSE 107* 97  BUN 20 17  CREATININE 0.67 0.66  CALCIUM 10.1 9.6   Liver Function Tests:  Recent Labs Lab 09/26/12 0245  AST 20  ALT 13  ALKPHOS 83  BILITOT 0.8  PROT 6.4  ALBUMIN 3.8   No results found for this basename: LIPASE, AMYLASE,  in the last 168 hours No results found for this basename: AMMONIA,  in the last 168 hours CBC:  Recent Labs Lab 09/25/12 2028 09/26/12 0245  WBC 4.6 3.5*  NEUTROABS 1.8  --   HGB 13.9 12.7  HCT 40.2 36.7  MCV 89.5 89.3  PLT 215 188   Cardiac Enzymes:  Recent Labs Lab 09/25/12 2028 09/26/12 0245 09/26/12 0832  TROPONINI <0.30 <0.30 <0.30   BNP: BNP (last 3 results) No results found for this basename: PROBNP,  in the last 8760 hours CBG: No results found for this basename: GLUCAP,  in the last 168 hours     Signed:  Raliegh Scobie  [facility] 09/26/2012, 12:07 PM

## 2012-11-16 ENCOUNTER — Other Ambulatory Visit: Payer: Self-pay | Admitting: Cardiology

## 2012-11-25 ENCOUNTER — Telehealth: Payer: Self-pay | Admitting: Cardiology

## 2012-11-25 NOTE — Telephone Encounter (Signed)
Yes

## 2012-11-25 NOTE — Telephone Encounter (Signed)
Pt made aware that the provider is currently providing care for other patients in office/hospital setting/out of office/or away from their desk. As soon as the provider responds to your concern/question/results, a member of our staff will contact you as soon as time permits. If the patient has not heard a response by the end of the business day (5pm) they will be contacted the following business day. Pt understood. PLEASE ADVISE

## 2012-11-25 NOTE — Telephone Encounter (Signed)
Patient wants to know if it is ok to take Pepcid [facility] with all the other "heart meds" she is taking / tgs

## 2012-11-25 NOTE — Telephone Encounter (Signed)
Spoke to pt to advise results/instructions. Pt understood.  

## 2012-12-25 ENCOUNTER — Other Ambulatory Visit: Payer: Self-pay

## 2012-12-25 MED ORDER — PRAVASTATIN SODIUM 40 MG PO TABS: 40.0000 mg | ORAL_TABLET | Freq: Every evening | ORAL | Status: DC

## 2012-12-29 ENCOUNTER — Telehealth: Payer: Self-pay | Admitting: *Deleted

## 2012-12-29 ENCOUNTER — Telehealth: Payer: Self-pay

## 2012-12-29 MED ORDER — PRAVASTATIN SODIUM 40 MG PO TABS: 40.0000 mg | ORAL_TABLET | Freq: Every evening | ORAL | Status: DC

## 2012-12-29 NOTE — Telephone Encounter (Signed)
RX REFILL FROM FAX PRAVASTATIN 40 MG #90 [facility] [phone] F

## 2012-12-29 NOTE — Telephone Encounter (Signed)
Rx refill sent to pharmacy. 

## 2012-12-31 ENCOUNTER — Other Ambulatory Visit: Payer: Self-pay

## 2013-01-15 ENCOUNTER — Other Ambulatory Visit: Payer: Self-pay | Admitting: Cardiology

## 2013-01-25 ENCOUNTER — Encounter: Payer: Self-pay | Admitting: Cardiology

## 2013-01-25 ENCOUNTER — Ambulatory Visit (INDEPENDENT_AMBULATORY_CARE_PROVIDER_SITE_OTHER): Payer: Self-pay | Admitting: Cardiology

## 2013-01-25 VITALS — BP 106/74 | HR 65 | Ht 63.0 in | Wt 133.0 lb

## 2013-01-25 DIAGNOSIS — I10 Essential (primary) hypertension: Secondary | ICD-10-CM

## 2013-01-25 DIAGNOSIS — I2510 Atherosclerotic heart disease of native coronary artery without angina pectoris: Secondary | ICD-10-CM

## 2013-01-25 DIAGNOSIS — E782 Mixed hyperlipidemia: Secondary | ICD-10-CM

## 2013-01-25 NOTE — Assessment & Plan Note (Signed)
Blood pressure is normal today. No changes made to regimen. 

## 2013-01-25 NOTE — Progress Notes (Signed)
Clinical Summary Erika Harrison is a 57 y.o.female last seen in July. Record review finds hospitalization back in August with chest pain in the setting of emotional stress. She ruled out for myocardial infarction at that time, was prescribed PPI for possible GI etiology. She is here for a routine visit, reports doing very well without any recurring chest pain, no nitroglycerin use. She states that she follows a low carbohydrate low sodium diet, has not had any substantial weight gain. She walks 30 minutes a day at a brisk pace.  Lipid panel from June 2013 showed cholesterol 138, triglycerides 54, HDL 57, LDL 70. She has not had interval lab work, I discussed this with her today. She continues to see Mr. Marcelline Mates for primary care. I asked her to make sure that she has an interval physical between our visits with screening lab work.  She reports compliance with her medications, no obvious side effects.  No Known Allergies  Current Outpatient Prescriptions  Medication Sig Dispense Refill  . aspirin 81 MG EC tablet Take 81 mg by mouth daily.        . Calcium Carbonate (CALTRATE 600) 1500 MG TABS Take 1 tablet by mouth daily.        . carvedilol (COREG) 3.125 MG tablet TAKE ONE TABLET BY MOUTH TWICE DAILY WITH FOOD  90 tablet  1  . clonazePAM (KLONOPIN) 1 MG tablet Take 1 mg by mouth 3 (three) times daily as needed.       Marland Kitchen lisinopril (PRINIVIL,ZESTRIL) 5 MG tablet TAKE ONE TABLET BY MOUTH ONCE DAILY  90 tablet  6  . nitroGLYCERIN (NITROSTAT) 0.4 MG SL tablet Place 1 tablet (0.4 mg total) under the tongue every 5 (five) minutes as needed.  100 tablet  3  . Omega-3 Fatty Acids (FISH OIL) 1000 MG CAPS Take 1 capsule by mouth daily.        Marland Kitchen omeprazole (PRILOSEC OTC) 20 MG tablet Take 1 tablet (20 mg total) by mouth daily.  30 tablet  0  . PARoxetine (PAXIL) 20 MG tablet Take 40 mg by mouth daily.      . polycarbophil (FIBERCON) 625 MG tablet Take 625 mg by mouth daily.        . pravastatin  (PRAVACHOL) 40 MG tablet Take 1 tablet (40 mg total) by mouth every evening.  90 tablet  3   No current facility-administered medications for this visit.    Past Medical History  Diagnosis Date  . Essential hypertension, benign   . Depression with anxiety   . Non-ST elevation MI (NSTEMI)     Ulcerated plaque mid LAD 2/12 - managed medically  . Coronary atherosclerosis of native coronary artery     Subtotally occluded distal LAD  . Mitral valve prolapse 1995    Social History Ms. Shake reports that she has never smoked. She has never used smokeless tobacco. Ms. Luepke reports that she does not drink alcohol.  Review of Systems No palpitations or syncope. No orthopnea or PND. No claudication or leg edema. Otherwise negative.  Physical Examination Filed Vitals:   01/25/13 1259  BP: 106/74  Pulse: 65   Filed Weights   01/25/13 1259  Weight: 133 lb (60.328 kg)    Normally nourished appearing woman, comfortable at rest..  HEENT: Conjunctiva and lids normal, oropharynx clear with moist mucosa.  Neck: Supple, no elevated JVP or carotid bruits, no thyromegaly.  Lungs: Clear to auscultation, nonlabored breathing at rest.  Cardiac: Regular rate and  rhythm, no S3 or significant systolic murmur, no pericardial rub.  Abdomen: Soft, nontender, bowel sounds present, no guarding or rebound.  Extremities: No pitting edema, distal pulses 2+.  Skin: Warm and dry.   Problem List and Plan   Coronary atherosclerosis of native coronary artery Symptomatically stable medical therapy. Continue aspirin, beta blocker, ACE inhibitor, and statin. She prefers an annual followup visit. I asked her to let us know if she is to be seen in the interim.  Mixed hyperlipidemia She continues on Lipitor. Last lipid panel in June 2013 showed good control. I asked her to have followup lab work with her primary care provider.  Essential hypertension, benign Blood pressure is normal today. No changes  made to regimen.    Jonelle Sidle, M.D., F.A.C.C.

## 2013-01-25 NOTE — Assessment & Plan Note (Signed)
Symptomatically stable medical therapy. Continue aspirin, beta blocker, ACE inhibitor, and statin. She prefers an annual followup visit. I asked her to let us know if she is to be seen in the interim.

## 2013-01-25 NOTE — Patient Instructions (Signed)
Your physician wants you to follow-up in: ONE YEAR You will receive a reminder letter in the mail two months in advance. If you don't receive a letter, please call our office to schedule the follow-up appointment.  

## 2013-01-25 NOTE — Assessment & Plan Note (Signed)
She continues on Lipitor. Last lipid panel in June 2013 showed good control. I asked her to have followup lab work with her primary care provider.

## 2013-02-15 ENCOUNTER — Other Ambulatory Visit: Payer: Self-pay | Admitting: Cardiology

## 2013-06-18 ENCOUNTER — Emergency Department (HOSPITAL_COMMUNITY)
Admission: EM | Admit: 2013-06-18 | Discharge: 2013-06-18 | Disposition: A | Discharge status: Home / Self Care | Payer: Self-pay | Attending: Emergency Medicine | Admitting: Emergency Medicine

## 2013-06-18 ENCOUNTER — Encounter (HOSPITAL_COMMUNITY): Payer: Self-pay | Admitting: Emergency Medicine

## 2013-06-18 DIAGNOSIS — I10 Essential (primary) hypertension: Secondary | ICD-10-CM | POA: Insufficient documentation

## 2013-06-18 DIAGNOSIS — S8990XA Unspecified injury of unspecified lower leg, initial encounter: Secondary | ICD-10-CM | POA: Insufficient documentation

## 2013-06-18 DIAGNOSIS — S99919A Unspecified injury of unspecified ankle, initial encounter: Secondary | ICD-10-CM

## 2013-06-18 DIAGNOSIS — M79606 Pain in leg, unspecified: Secondary | ICD-10-CM

## 2013-06-18 DIAGNOSIS — Y929 Unspecified place or not applicable: Secondary | ICD-10-CM | POA: Insufficient documentation

## 2013-06-18 DIAGNOSIS — M79609 Pain in unspecified limb: Secondary | ICD-10-CM | POA: Insufficient documentation

## 2013-06-18 DIAGNOSIS — Z79899 Other long term (current) drug therapy: Secondary | ICD-10-CM | POA: Insufficient documentation

## 2013-06-18 DIAGNOSIS — I2510 Atherosclerotic heart disease of native coronary artery without angina pectoris: Secondary | ICD-10-CM | POA: Insufficient documentation

## 2013-06-18 DIAGNOSIS — S99929A Unspecified injury of unspecified foot, initial encounter: Secondary | ICD-10-CM

## 2013-06-18 DIAGNOSIS — Z7982 Long term (current) use of aspirin: Secondary | ICD-10-CM | POA: Insufficient documentation

## 2013-06-18 DIAGNOSIS — W208XXA Other cause of strike by thrown, projected or falling object, initial encounter: Secondary | ICD-10-CM | POA: Insufficient documentation

## 2013-06-18 DIAGNOSIS — Y9389 Activity, other specified: Secondary | ICD-10-CM | POA: Insufficient documentation

## 2013-06-18 DIAGNOSIS — F341 Dysthymic disorder: Secondary | ICD-10-CM | POA: Insufficient documentation

## 2013-06-18 DIAGNOSIS — I252 Old myocardial infarction: Secondary | ICD-10-CM | POA: Insufficient documentation

## 2013-06-18 DIAGNOSIS — M549 Dorsalgia, unspecified: Secondary | ICD-10-CM | POA: Insufficient documentation

## 2013-06-18 NOTE — Discharge Instructions (Signed)
I suspect that you have a pinched nerve in your lower back, causing your pain.  It will be important to follow up with your doctor, to arrange further treatment, if you don't improve, within a few days.

## 2013-06-18 NOTE — ED Notes (Signed)
Pt received discharge instructions, verbalized understanding and has no further questions. Pt ambulated to exit in stable condition accompanied by daughter.  Advised to return to emergency department with new or worsening symptoms.  

## 2013-06-18 NOTE — ED Notes (Addendum)
Patient c/o "deep thigh pain " that radiates down left leg. Patient denies numbness and tingling in leg or foot feeling cold. Denies any known injury, denies hx of DVT.

## 2013-06-18 NOTE — ED Provider Notes (Signed)
CSN: [id number]     Arrival date & time 06/18/13  1739 History   First MD Initiated Contact with Patient 06/18/13 1838     Chief Complaint  Patient presents with  . Leg Pain     (Consider location/radiation/quality/duration/timing/severity/associated sxs/prior Treatment) Patient is a 58 y.o. female presenting with leg pain. The history is provided by the patient.  Leg Pain  She complains of pain in left leg for 6 months. The discomfort is described as burning. Discomfort is worsening and become intense in the last 3 days. No trauma prior to onset of the pain. She did injure her left foot when something dropped on it about one week ago while shopping. She is not currently employed. She denies constricting garments, or wearing a heavy tool belt. She states that she has had back problems in the past, several times. She has not had formal evaluation by a specialist were advanced imaging. She denies fever, chills, nausea, vomiting, change in bowel or urinary habits. She is able to walk. There are no other known modifying factors.  Past Medical History  Diagnosis Date  . Essential hypertension, benign   . Depression with anxiety   . Non-ST elevation MI (NSTEMI)     Ulcerated plaque mid LAD 2/12 - managed medically  . Coronary atherosclerosis of native coronary artery     Subtotally occluded distal LAD  . Mitral valve prolapse 1995   Past Surgical History  Procedure Laterality Date  . Laparoscopic abdominal surgery    . Right breast surgery     Family History  Problem Relation Age of Onset  . Cancer Father     Renal cell carcinoma  . Rheum arthritis Brother   . Hypertension Sister   . Drug abuse Brother    History  Substance Use Topics  . Smoking status: Never Smoker   . Smokeless tobacco: Never Used  . Alcohol Use: No   OB History   Grav Para Term Preterm Abortions TAB SAB Ect Mult Living                 Review of Systems  All other systems reviewed and are  negative.     Allergies  Review of patient's allergies indicates no known allergies.  Home Medications   Prior to Admission medications   Medication Sig Start Date End Date Taking? Authorizing Provider  aspirin 81 MG EC tablet Take 81 mg by mouth daily.      Historical Provider, MD  Calcium Carbonate (CALTRATE 600) 1500 MG TABS Take 1 tablet by mouth daily.      Historical Provider, MD  carvedilol (COREG) 3.125 MG tablet TAKE ONE TABLET BY MOUTH TWICE DAILY WITH FOOD 02/15/13   Jonelle Sidle, MD  clonazePAM (KLONOPIN) 1 MG tablet Take 1 mg by mouth 3 (three) times daily as needed.     Historical Provider, MD  lisinopril (PRINIVIL,ZESTRIL) 5 MG tablet TAKE ONE TABLET BY MOUTH ONCE DAILY 01/15/13   Jonelle Sidle, MD  nitroGLYCERIN (NITROSTAT) 0.4 MG SL tablet Place 1 tablet (0.4 mg total) under the tongue every 5 (five) minutes as needed. 07/25/11   Jonelle Sidle, MD  Omega-3 Fatty Acids (FISH OIL) 1000 MG CAPS Take 1 capsule by mouth daily.      Historical Provider, MD  omeprazole (PRILOSEC OTC) 20 MG tablet Take 1 tablet (20 mg total) by mouth daily. 09/26/12   Erick Blinks, MD  PARoxetine (PAXIL) 20 MG tablet Take 40 mg by mouth daily.  Historical Provider, MD  polycarbophil (FIBERCON) 625 MG tablet Take 625 mg by mouth daily.      Historical Provider, MD  pravastatin (PRAVACHOL) 40 MG tablet Take 1 tablet (40 mg total) by mouth every evening. 12/29/12 12/29/13  Jonelle SidleSamuel G McDowell, MD   BP 135/83  Pulse 92  Temp(Src) 98.4 F (36.9 C) (Oral)  Resp 18  Ht 5\' 3"  (1.6 m)  Wt 131 lb (59.421 kg)  BMI 23.21 kg/m2  SpO2 100%  LMP 12/27/2010 Physical Exam  Nursing note and vitals reviewed. Constitutional: She is oriented to person, place, and time. She appears well-developed and well-nourished.  HENT:  Head: Normocephalic and atraumatic.  Eyes: Conjunctivae and EOM are normal. Pupils are equal, round, and reactive to light.  Neck: Normal range of motion and phonation normal.  Neck supple.  Cardiovascular: Normal rate, regular rhythm and intact distal pulses.   Pulmonary/Chest: Effort normal and breath sounds normal. She exhibits no tenderness.  Abdominal: Soft. She exhibits no distension. There is no tenderness. There is no guarding.  Musculoskeletal: Normal range of motion.  No swelling, deformity, or visual abnormality of the left leg. Normal light touch sensation of the entire left leg. Negative straight leg raising, bilaterally. Negative Homans left calf. Normal distal perfusion evidenced by normal. Capillary refill in both feet. Normal range of motion of the upper and lower back. No palpable tenderness of the spinous processes of the thoracic, and lumbar spine, and no palpable paravertebral tenderness.  Neurological: She is alert and oriented to person, place, and time. She exhibits normal muscle tone.  Skin: Skin is warm and dry.  Psychiatric: She has a normal mood and affect. Her behavior is normal. Judgment and thought content normal.    ED Course  Procedures (including critical care time)  I discussed the findings with the patient and her daughter, who was with her in the room. Suggested that she take prednisone, and she stated that she did not want to do this. She became visually, agitated when talking about prednisone. She would not tell me what her concern with prednisone, was. I then suggested that she take ibuprofen. At that point, she said that her cardiologist told her to never take it. I explained that I thought it was appropriate to take a short course of ibuprofen, in patients with her condition. She became very angry at this suggestion, because "my cardiologist told me to never take it." She continued to get more and more angry and aggressive. Her daughter, who was with her at the time, tried to calm him down but the mother, would not de-escalate. I apologized several times, then*she was ready to go and she stated she would wait for the instructions. I  explained the situation to the nurse prior to her going into the room for discharge     MDM   Final diagnoses:  Leg pain  Back pain   Nonspecific left leg discomfort. This discomfort is unlikely to be related to a blood clot. I doubt cauda equina syndrome. There is no evidence for discitis, lumbar myelopathy, serious bacterial infection, or arterial insufficiency.  Nursing Notes Reviewed/ Care Coordinated Applicable Imaging Reviewed Interpretation of Laboratory Data incorporated into ED treatment  The patient appears reasonably screened and/or stabilized for discharge and I doubt any other medical condition or other [facility] requiring further screening, evaluation, or treatment in the ED at this time prior to discharge.  Plan: Home Medications- usual; Home Treatments- rest; return here if the recommended treatment, does not  improve the symptoms; Recommended follow up- PCP prn    Flint MelterElliott L Dace Denn, MD 06/18/13 2220

## 2013-06-18 NOTE — Progress Notes (Signed)
Error

## 2013-06-21 ENCOUNTER — Encounter: Payer: Self-pay | Admitting: Adult Health

## 2013-10-22 IMAGING — CR DG CHEST 1V PORT
1 series · 1 of 1 positions shown · non-contrast
Comparison: 02/26/2011

CLINICAL DATA: Chest pain

PORTABLE CHEST - 1 VIEW

[portable]
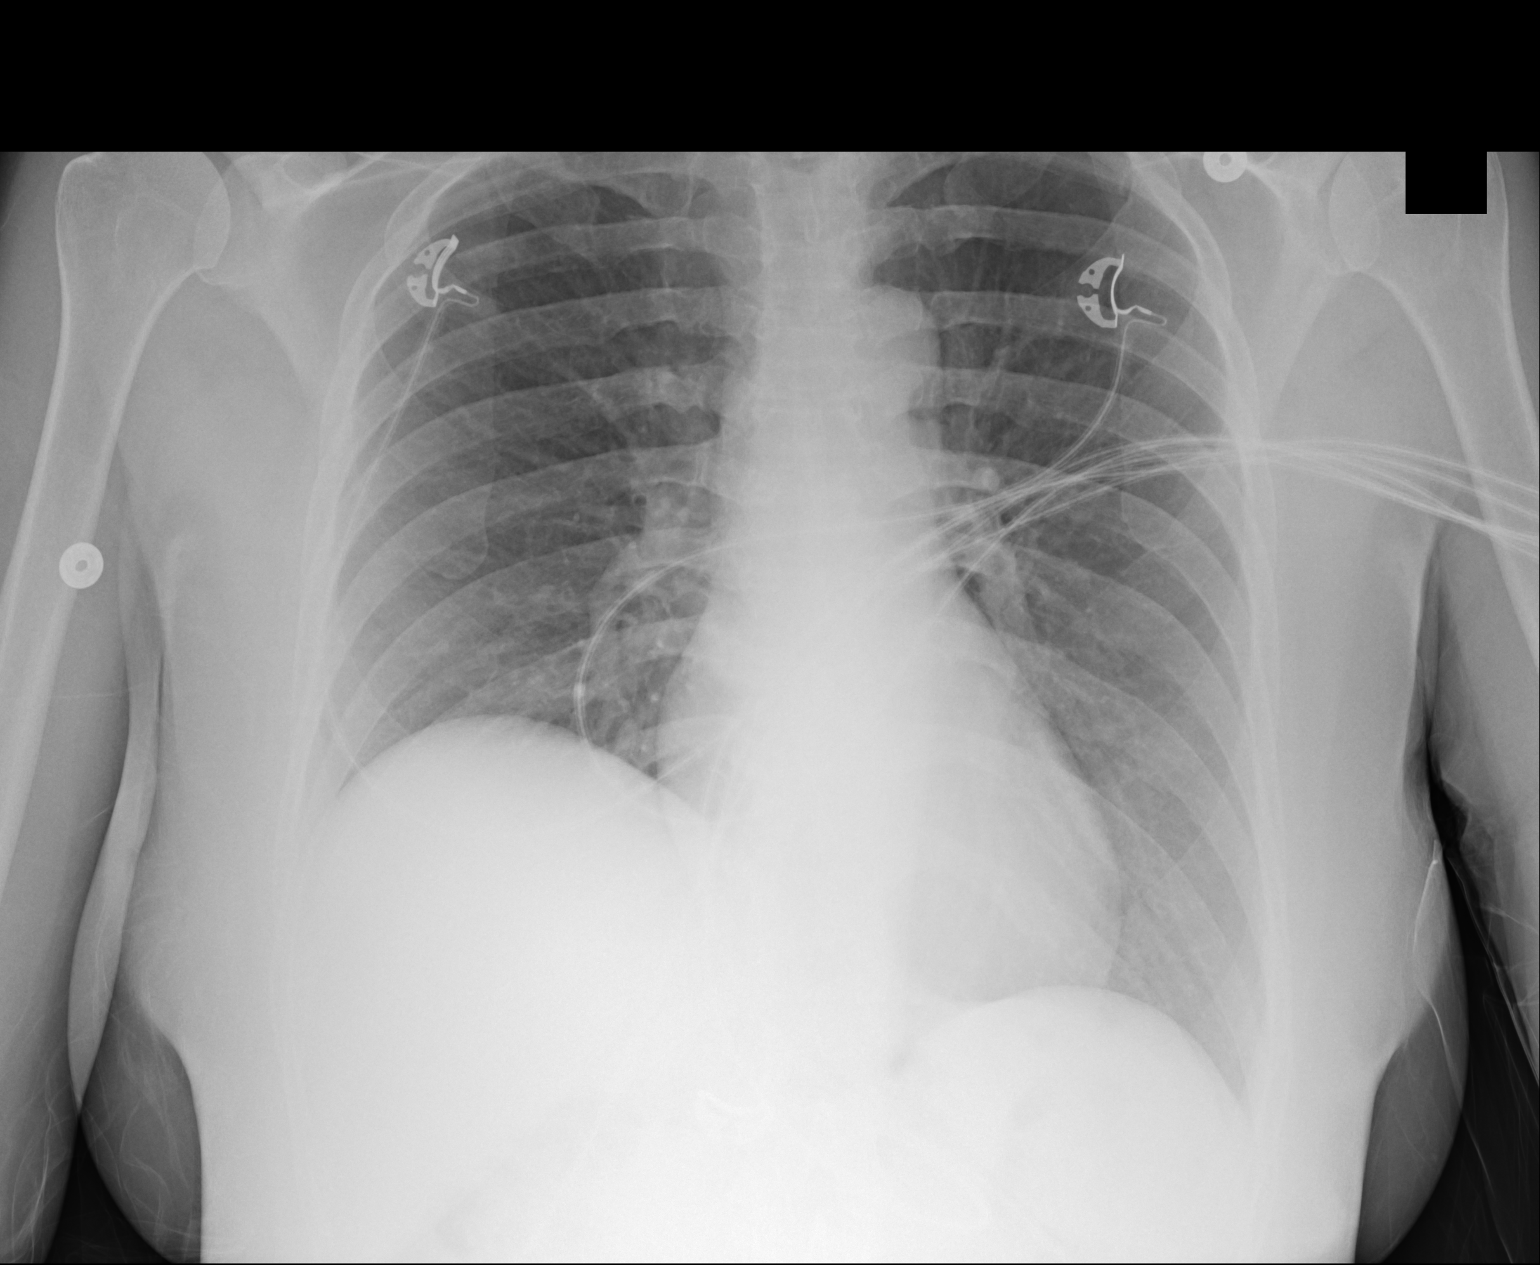

[1 of 1 positions shown; findings below may reference images not displayed]

FINDINGS: Lungs are clear. No pleural effusion or pneumothorax.

Mild elevation of the hemidiaphragm.

The heart is normal in size.
IMPRESSION: No evidence of acute cardiopulmonary disease.

## 2013-10-31 ENCOUNTER — Encounter (HOSPITAL_COMMUNITY): Payer: Self-pay | Admitting: Emergency Medicine

## 2013-10-31 ENCOUNTER — Emergency Department (HOSPITAL_COMMUNITY)
Admission: EM | Admit: 2013-10-31 | Discharge: 2013-10-31 | Disposition: A | Discharge status: Home / Self Care | Payer: Self-pay | Attending: Emergency Medicine | Admitting: Emergency Medicine

## 2013-10-31 DIAGNOSIS — I2510 Atherosclerotic heart disease of native coronary artery without angina pectoris: Secondary | ICD-10-CM | POA: Insufficient documentation

## 2013-10-31 DIAGNOSIS — I10 Essential (primary) hypertension: Secondary | ICD-10-CM | POA: Insufficient documentation

## 2013-10-31 DIAGNOSIS — N898 Other specified noninflammatory disorders of vagina: Secondary | ICD-10-CM | POA: Insufficient documentation

## 2013-10-31 DIAGNOSIS — N939 Abnormal uterine and vaginal bleeding, unspecified: Secondary | ICD-10-CM

## 2013-10-31 DIAGNOSIS — I252 Old myocardial infarction: Secondary | ICD-10-CM | POA: Insufficient documentation

## 2013-10-31 DIAGNOSIS — F341 Dysthymic disorder: Secondary | ICD-10-CM | POA: Insufficient documentation

## 2013-10-31 DIAGNOSIS — Z79899 Other long term (current) drug therapy: Secondary | ICD-10-CM | POA: Insufficient documentation

## 2013-10-31 DIAGNOSIS — Z7982 Long term (current) use of aspirin: Secondary | ICD-10-CM | POA: Insufficient documentation

## 2013-10-31 LAB — CBC
HCT: 40 % (ref 36.0–46.0)
HEMOGLOBIN: 14 g/dL (ref 12.0–15.0)
MCH: 30.8 pg (ref 26.0–34.0)
MCHC: 35 g/dL (ref 30.0–36.0)
MCV: 87.9 fL (ref 78.0–100.0)
PLATELETS: 206 10*3/uL (ref 150–400)
RBC: 4.55 MIL/uL (ref 3.87–5.11)
RDW: 12 % (ref 11.5–15.5)
WBC: 4.5 10*3/uL (ref 4.0–10.5)

## 2013-10-31 LAB — URINALYSIS, ROUTINE W REFLEX MICROSCOPIC
BILIRUBIN URINE: NEGATIVE
Glucose, UA: NEGATIVE mg/dL
Ketones, ur: NEGATIVE mg/dL
LEUKOCYTES UA: NEGATIVE
NITRITE: NEGATIVE
PH: 7 (ref 5.0–8.0)
Protein, ur: NEGATIVE mg/dL
Urobilinogen, UA: 0.2 mg/dL (ref 0.0–1.0)

## 2013-10-31 LAB — BASIC METABOLIC PANEL
ANION GAP: 9 (ref 5–15)
BUN: 21 mg/dL (ref 6–23)
CALCIUM: 9.5 mg/dL (ref 8.4–10.5)
CO2: 29 mEq/L (ref 19–32)
Chloride: 102 mEq/L (ref 96–112)
Creatinine, Ser: 0.72 mg/dL (ref 0.50–1.10)
Glucose, Bld: 101 mg/dL — ABNORMAL HIGH (ref 70–99)
POTASSIUM: 4.5 meq/L (ref 3.7–5.3)
SODIUM: 140 meq/L (ref 137–147)

## 2013-10-31 LAB — APTT: APTT: 33 s (ref 24–37)

## 2013-10-31 LAB — URINE MICROSCOPIC-ADD ON

## 2013-10-31 LAB — PROTIME-INR
INR: 0.92 (ref 0.00–1.49)
PROTHROMBIN TIME: 12.4 s (ref 11.6–15.2)

## 2013-10-31 NOTE — ED Provider Notes (Signed)
CSN: [id number]     Arrival date & time 10/31/13  1044 History   This chart was scribed for Vida Roller, MD, by Yevette Edwards, ED Scribe. This patient was seen in room APA09/APA09 and the patient's care was started at 11:18 AM.  First MD Initiated Contact with Patient 10/31/13 1116     Chief Complaint  Patient presents with  . Vaginal Bleeding    The history is provided by the patient. No language interpreter was used.   HPI Comments: Erika Harrison is a 58 y.o. female who presents to the Emergency Department complaining of bright red "spotting" which was present on the toilet paper this morning. She reports she normally places her thumb in her vaginal to assist in expression of bowel from the rectum, and she noticed blood upon her thumb. She further explored and found more blood on the left side. She states she has not had a menstruation in three years. Erika Harrison reports she has been informed she had "webbing" which was suspicious for PID, and it was recommended that she have a parital hysterecomy which she deferred. She denies chronic abdominal pain. She denies a h/o abdominal surgery. She also denies dysuria, vaginal pain, diarrhea, hematochezia, nausea, emesis, fever, chills, cough, headaches, dizziness, lightheadedness, lower extremity swelling, numbness, or paresthesia. She denies use of hormone replacement therapy,  Increased sexual activity, or trauma. Erika Harrison takes a baby aspirin daily; she has a h/o two MI. She no longer takes Plavix. She denies cardiac stents.   Past Medical History  Diagnosis Date  . Essential hypertension, benign   . Depression with anxiety   . Non-ST elevation MI (NSTEMI)     Ulcerated plaque mid LAD 2/12 - managed medically  . Coronary atherosclerosis of native coronary artery     Subtotally occluded distal LAD  . Mitral valve prolapse 1995   Past Surgical History  Procedure Laterality Date  . Laparoscopic abdominal surgery    . Right  breast surgery     Family History  Problem Relation Age of Onset  . Cancer Father     Renal cell carcinoma  . Rheum arthritis Brother   . Hypertension Sister   . Drug abuse Brother    History  Substance Use Topics  . Smoking status: Never Smoker   . Smokeless tobacco: Never Used  . Alcohol Use: No   No OB history provided.  Review of Systems  Constitutional: Negative for fever and chills.  Respiratory: Negative for cough.   Cardiovascular: Negative for leg swelling.  Gastrointestinal: Negative for abdominal pain, diarrhea, constipation and blood in stool.  Genitourinary: Positive for vaginal bleeding. Negative for dysuria and vaginal pain.  Neurological: Negative for weakness, numbness and headaches.  All other systems reviewed and are negative.   Allergies  Review of patient's allergies indicates no known allergies.  Home Medications   Prior to Admission medications   Medication Sig Start Date End Date Taking? Authorizing Provider  aspirin 81 MG EC tablet Take 81 mg by mouth daily.     Yes Historical Provider, MD  carvedilol (COREG) 3.125 MG tablet Take 3.125 mg by mouth 2 (two) times daily with a meal.   Yes Historical Provider, MD  clonazePAM (KLONOPIN) 1 MG tablet Take 0.25-1 mg by mouth 3 (three) times daily as needed for anxiety.    Yes Historical Provider, MD  lisinopril (PRINIVIL,ZESTRIL) 5 MG tablet Take 5 mg by mouth daily.   Yes Historical Provider, MD  Multiple Vitamin (MULTIVITAMIN  WITH MINERALS) TABS tablet Take 1 tablet by mouth daily.   Yes Historical Provider, MD  naproxen sodium (ANAPROX) 220 MG tablet Take 220 mg by mouth 2 (two) times daily as needed (Back Pain).   Yes Historical Provider, MD  Omega-3 Fatty Acids (FISH OIL) 1000 MG CAPS Take 1 capsule by mouth daily.     Yes Historical Provider, MD  PARoxetine (PAXIL) 20 MG tablet Take 40 mg by mouth daily.   Yes Historical Provider, MD  polycarbophil (FIBERCON) 625 MG tablet Take 625 mg by mouth daily.      Yes Historical Provider, MD  pravastatin (PRAVACHOL) 40 MG tablet Take 1 tablet (40 mg total) by mouth every evening. 12/29/12 12/29/13 Yes Jonelle Sidle, MD  nitroGLYCERIN (NITROSTAT) 0.4 MG SL tablet Place 1 tablet (0.4 mg total) under the tongue every 5 (five) minutes as needed. 07/25/11   Jonelle Sidle, MD   Triage Vitals: BP 112/76  Pulse 66  Temp(Src) 98.8 F (37.1 C) (Oral)  Resp 18  Ht  (1.6 m)  Wt 131 lb (59.421 kg)  BMI 23.21 kg/m2  SpO2 100%  LMP 12/27/2010  Physical Exam  Nursing note and vitals reviewed. Constitutional: She appears well-developed and well-nourished. No distress.  HENT:  Head: Normocephalic and atraumatic.  Mouth/Throat: Oropharynx is clear and moist. No oropharyngeal exudate.  Eyes: Conjunctivae and EOM are normal. Pupils are equal, round, and reactive to light. Right eye exhibits no discharge. Left eye exhibits no discharge. No scleral icterus.  Neck: Normal range of motion. Neck supple. No JVD present. No thyromegaly present.  Cardiovascular: Normal rate, regular rhythm, normal heart sounds and intact distal pulses.  Exam reveals no gallop and no friction rub.   No murmur heard. Pulmonary/Chest: Effort normal and breath sounds normal. No respiratory distress. She has no wheezes. She has no rales.  Abdominal: Soft. Bowel sounds are normal. She exhibits no distension and no mass. There is no tenderness.  Genitourinary:  Chaperone present for pelvic exam. Has normal appearing external genitalia - mild prolapse, vaginal vault and cervix has areas of mild spot bleeding, no blood from Os, no growths or masses.  No d/c and no foul odor  Musculoskeletal: Normal range of motion. She exhibits no edema and no tenderness.  Lymphadenopathy:    She has no cervical adenopathy.  Neurological: She is alert. Coordination normal.  Skin: Skin is warm and dry. No rash noted. No erythema.  Psychiatric: She has a normal mood and affect. Her behavior is normal.     ED Course  Procedures (including critical care time)  DIAGNOSTIC STUDIES: Oxygen Saturation is 100% on room air, normal by my interpretation.    COORDINATION OF CARE:  11:28 AM- Discussed treatment plan with patient, and the patient agreed to the plan. The plan includes a pelvic exam and lab work.   Labs Review Labs Reviewed  URINALYSIS, ROUTINE W REFLEX MICROSCOPIC - Abnormal; Notable for the following:    Specific Gravity, Urine <1.005 (*)    Hgb urine dipstick TRACE (*)    All other components within normal limits  BASIC METABOLIC PANEL - Abnormal; Notable for the following:    Glucose, Bld 101 (*)    All other components within normal limits  CBC  PROTIME-INR  APTT  URINE MICROSCOPIC-ADD ON    Imaging Review No results found.    MDM   Final diagnoses:  Vaginal bleeding    Pt give reassurance and can f/u with GYN, labs unremarkable -  GYN as outpt, pt in agreement.   I personally performed the services described in this documentation, which was scribed in my presence. The recorded information has been reviewed and is accurate.      Vida Roller, MD 10/31/13 [phone]

## 2013-10-31 NOTE — ED Notes (Signed)
Having bright red bleeding from vagina this am.  Following manually removing bowel with light pressure.  Noticed about 2ml of blood.  Also having burning when voiding.

## 2013-11-29 ENCOUNTER — Telehealth: Payer: Self-pay | Admitting: *Deleted

## 2013-11-29 NOTE — Telephone Encounter (Signed)
Pt is wanting seen sooner then her date in December to see Dr Diona BrownerMcdowell. She thinks she is due for lab work also. She moved her appt up to end of October, Please call her when orders are put in/tmj

## 2013-11-29 NOTE — Telephone Encounter (Signed)
What orders are needed?

## 2013-11-30 NOTE — Telephone Encounter (Signed)
Per Dr.McDowell note,pcp follows pt's lipids and she will check with him

## 2013-11-30 NOTE — Telephone Encounter (Signed)
Pt remembers Dr Diona BrownerMcdowell saying something at last visit about having a A1C and some other labs done. She said no to worry about it if its not in her records, she was just checking. She will see him first and go from that/tmj

## 2013-12-21 ENCOUNTER — Encounter: Payer: Self-pay | Admitting: Cardiology

## 2013-12-21 ENCOUNTER — Ambulatory Visit (INDEPENDENT_AMBULATORY_CARE_PROVIDER_SITE_OTHER): Payer: Self-pay | Admitting: Cardiology

## 2013-12-21 VITALS — BP 118/80 | HR 70 | Ht 63.0 in | Wt 134.0 lb

## 2013-12-21 DIAGNOSIS — I10 Essential (primary) hypertension: Secondary | ICD-10-CM

## 2013-12-21 DIAGNOSIS — R002 Palpitations: Secondary | ICD-10-CM

## 2013-12-21 DIAGNOSIS — E785 Hyperlipidemia, unspecified: Secondary | ICD-10-CM

## 2013-12-21 DIAGNOSIS — E782 Mixed hyperlipidemia: Secondary | ICD-10-CM

## 2013-12-21 DIAGNOSIS — I2510 Atherosclerotic heart disease of native coronary artery without angina pectoris: Secondary | ICD-10-CM

## 2013-12-21 LAB — HEMOGLOBIN A1C
Hgb A1c MFr Bld: 5.3 % (ref <5.7)
Mean Plasma Glucose: 105 mg/dL (ref <117)

## 2013-12-21 LAB — HEPATIC FUNCTION PANEL
ALBUMIN: 4.4 g/dL (ref 3.5–5.2)
ALT: 26 U/L (ref 0–35)
AST: 28 U/L (ref 0–37)
Alkaline Phosphatase: 70 U/L (ref 39–117)
BILIRUBIN DIRECT: 0.3 mg/dL (ref 0.0–0.3)
BILIRUBIN TOTAL: 1.2 mg/dL (ref 0.2–1.2)
Indirect Bilirubin: 0.9 mg/dL (ref 0.2–1.2)
Total Protein: 6.4 g/dL (ref 6.0–8.3)

## 2013-12-21 LAB — LIPID PANEL
CHOL/HDL RATIO: 2.2 ratio
Cholesterol: 127 mg/dL (ref 0–200)
HDL: 58 mg/dL (ref >39)
LDL Cholesterol: 60 mg/dL (ref 0–99)
Triglycerides: 47 mg/dL (ref <150)
VLDL: 9 mg/dL (ref 0–40)

## 2013-12-21 MED ORDER — CARVEDILOL 6.25 MG PO TABS: 6.2500 mg | ORAL_TABLET | Freq: Two times a day (BID) | ORAL | Status: DC

## 2013-12-21 NOTE — Patient Instructions (Addendum)
Your physician wants you to follow-up in: 1 year You will receive a reminder letter in the mail two months in advance. If you don't receive a letter, please call our office to schedule the follow-up appointment.    Your physician has recommended you make the following change in your medication:     INCREASE Coreg to 6.25 mg twice a day   Please get blood work  ([id number],LFT's,A1C)     Thank you for choosing [facility] !

## 2013-12-21 NOTE — Assessment & Plan Note (Signed)
Blood pressure well controlled today. If she has dizziness or further decrease in blood pressure after increase in Coreg, we will down titrate lisinopril.

## 2013-12-21 NOTE — Assessment & Plan Note (Signed)
Follow-up FLP and LFTs to be obtained today. Continue Pravachol and omega-3 supplements.

## 2013-12-21 NOTE — Assessment & Plan Note (Signed)
Stable on medical therapy. ECG reviewed and also stable. I encouraged her to continue regular diet and exercise. Anticipate annual follow-up.

## 2013-12-21 NOTE — Assessment & Plan Note (Signed)
Brief and intermittent as outlined above. ECG shows sinus rhythm today. No documented arrhythmia, reported history of MVP although no click on examination. We will increase Coreg to 6.25 mg twice daily for now.

## 2013-12-21 NOTE — Progress Notes (Signed)
Reason for visit: Follow-up CAD, HTN, hyperlipidemia  Clinical Summary [name] is a 58 y.o.female last seen in December 2014. She denies any angina symptoms, still exercises 5 or 6 days a week for at least 30 minutes at a time. She enjoys walking for aerobic activity. With this she has NYHA class 1-2 dyspnea, no claudication. Her ECG today shows normal sinus rhythm with low voltage. Continues on aspirin, Coreg, ACE inhibitor, and as needed nitroglycerin.  She has been bothered by intermittent palpitations, usually lasting no more than 10-15 seconds. Tends to be fairly sporadic. This has been somewhat more frequent in the last 6 months. Mild dizziness but no syncope associated with this. She continues on low-dose Coreg. States that she has cut out caffeine in her diet.  Lab work in September showed hemoglobin 14.0, platelets 206, potassium 4.5, BUN 21, creatine 0.7. He is due for follow-up lipid profile with LFTs, also hemoglobin A1c. States that she has been watching her diet, eats high fiber.  LDL was 70 back in June 2013. She continues on omega-3 supplements and Pravachol, tolerating both.  No Known Allergies  Current Outpatient Prescriptions  Medication Sig Dispense Refill  . aspirin 81 MG EC tablet Take 81 mg by mouth daily.        . clonazePAM (KLONOPIN) 1 MG tablet Take 0.25-1 mg by mouth 3 (three) times daily as needed for anxiety.       Marland Kitchen. lisinopril (PRINIVIL,ZESTRIL) 5 MG tablet Take 5 mg by mouth daily.      . Multiple Vitamin (MULTIVITAMIN WITH MINERALS) TABS tablet Take 1 tablet by mouth daily.      . naproxen sodium (ANAPROX) 220 MG tablet Take 220 mg by mouth 2 (two) times daily as needed (Back Pain).      . nitroGLYCERIN (NITROSTAT) 0.4 MG SL tablet Place 1 tablet (0.4 mg total) under the tongue every 5 (five) minutes as needed.  100 tablet  3  . Omega-3 Fatty Acids (FISH OIL) 1000 MG CAPS Take 1 capsule by mouth daily.        Marland Kitchen. PARoxetine (PAXIL) 20 MG tablet Take 40  mg by mouth daily.      . polycarbophil (FIBERCON) 625 MG tablet Take 625 mg by mouth daily.        . pravastatin (PRAVACHOL) 40 MG tablet Take 1 tablet (40 mg total) by mouth every evening.  90 tablet  3  . carvedilol (COREG) 6.25 MG tablet Take 1 tablet (6.25 mg total) by mouth 2 (two) times daily.  180 tablet  3   No current facility-administered medications for this visit.    Past Medical History  Diagnosis Date  . Essential hypertension, benign   . Depression with anxiety   . Non-ST elevation MI (NSTEMI)     Ulcerated plaque mid LAD 2/12 - managed medically  . Coronary atherosclerosis of native coronary artery     Subtotally occluded distal LAD  . Mitral valve prolapse 1995    Social History [name] reports that she has never smoked. She has never used smokeless tobacco. [name] reports that she does not drink alcohol.  Review of Systems Complete review of systems negative except as otherwise outlined in the clinical summary and also the following. Intermittent constipation. No orthopnea or PND. No abdominal pain.  Physical Examination Filed Vitals:   12/21/13 0820  BP: 118/80  Pulse: 70   Filed Weights   12/21/13 0820  Weight: 134 lb (60.782 kg)  Normally nourished appearing woman, appears comfortable at rest..  HEENT: Conjunctiva and lids normal, oropharynx clear with moist mucosa.  Neck: Supple, no elevated JVP or carotid bruits, no thyromegaly.  Lungs: Clear to auscultation, nonlabored breathing at rest.  Cardiac: Regular rate and rhythm, no S3 or significant systolic murmur, no pericardial rub.  Abdomen: Soft, nontender, bowel sounds present, no guarding or rebound.  Extremities: No pitting edema, distal pulses 2+.  Skin: Warm and dry. Musculoskeletal: No kyphosis. Neuropsychiatric: Alert and oriented 3, affect appropriate.   Problem List and Plan   Coronary atherosclerosis of native coronary artery Stable on medical therapy. ECG reviewed  and also stable. I encouraged her to continue regular diet and exercise. Anticipate annual follow-up.  Palpitations Brief and intermittent as outlined above. ECG shows sinus rhythm today. No documented arrhythmia, reported history of MVP although no click on examination. We will increase Coreg to 6.25 mg twice daily for now.  Essential hypertension, benign Blood pressure well controlled today. If she has dizziness or further decrease in blood pressure after increase in Coreg, we will down titrate lisinopril.  Mixed hyperlipidemia Follow-up FLP and LFTs to be obtained today. Continue Pravachol and omega-3 supplements.    Jonelle SidleSamuel G. Braedan Meuth, M.D., F.A.C.C.

## 2013-12-22 ENCOUNTER — Telehealth: Payer: Self-pay | Admitting: *Deleted

## 2013-12-22 ENCOUNTER — Ambulatory Visit: Payer: Self-pay | Admitting: Cardiology

## 2013-12-22 NOTE — Telephone Encounter (Signed)
Pt aware, forwarded to Dr. Loreta AveMann

## 2013-12-22 NOTE — Telephone Encounter (Signed)
Message copied by Vernon PreyBARKER, Yoseline Andersson T on Wed Dec 22, 2013  7:54 AM ------      Message from: MCDOWELL, Illene BolusSAMUEL G      Created: Wed Dec 22, 2013  7:33 AM       Reviewed. Let her know that hemoglobin A1c looks good at 5.3. ------

## 2013-12-29 ENCOUNTER — Telehealth: Payer: Self-pay | Admitting: Cardiology

## 2013-12-29 NOTE — Telephone Encounter (Signed)
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 /t gs

## 2013-12-30 NOTE — Telephone Encounter (Signed)
Pt placed on Septra, I told her she could continue her medications as normal

## 2014-01-11 ENCOUNTER — Ambulatory Visit: Payer: Self-pay | Admitting: Cardiology

## 2014-02-08 ENCOUNTER — Telehealth: Payer: Self-pay | Admitting: *Deleted

## 2014-02-08 MED ORDER — LISINOPRIL 5 MG PO TABS: 5.0000 mg | ORAL_TABLET | Freq: Every day | ORAL | Status: DC

## 2014-02-08 NOTE — Telephone Encounter (Signed)
Pt needs lisinopril called in to [facility] in [place]

## 2014-02-09 ENCOUNTER — Other Ambulatory Visit: Payer: Self-pay | Admitting: Cardiology

## 2014-02-09 NOTE — Telephone Encounter (Signed)
refilled 

## 2014-02-09 NOTE — Telephone Encounter (Signed)
Received fax refill request ° °Rx # [id number] °Medication:  Lisinopril 5 mg tab °Qty 90 °Sig:  Take one tablet by mouth once daily °Physician:  McDowell ° ° °

## 2014-02-09 NOTE — Telephone Encounter (Signed)
Received fax refill request  Rx # [id number] Medication:  Lisinopril 5 mg tab Qty 90 Sig:  Take one tablet by mouth once daily Physician:  Diona BrownerMcDowell

## 2014-02-09 NOTE — Telephone Encounter (Signed)
Refilled on 12/15.

## 2014-11-14 ENCOUNTER — Other Ambulatory Visit: Payer: Self-pay | Admitting: Cardiology

## 2015-03-15 ENCOUNTER — Encounter: Payer: Self-pay | Admitting: Cardiology

## 2015-03-15 ENCOUNTER — Ambulatory Visit (INDEPENDENT_AMBULATORY_CARE_PROVIDER_SITE_OTHER): Payer: Self-pay | Admitting: Cardiology

## 2015-03-15 VITALS — BP 90/68 | HR 63 | Ht 63.0 in | Wt 128.0 lb

## 2015-03-15 DIAGNOSIS — I2510 Atherosclerotic heart disease of native coronary artery without angina pectoris: Secondary | ICD-10-CM

## 2015-03-15 DIAGNOSIS — I10 Essential (primary) hypertension: Secondary | ICD-10-CM

## 2015-03-15 DIAGNOSIS — R002 Palpitations: Secondary | ICD-10-CM

## 2015-03-15 NOTE — Progress Notes (Signed)
Cardiology Office Note  Date: 03/15/2015   ID: Erika Harrison, DOB 1955/12/30, MRN [id number]  PCP: Lenise Herald, PA-C  Primary Cardiologist: Nona Dell, MD   Chief Complaint  Patient presents with  . Coronary Artery Disease    History of Present Illness: Erika Harrison is a 60 y.o. female last seen in October 2015. She presents for a routine follow-up visit. From a cardiac perspective, she has not had any significant angina symptoms or functional limitations. She enjoys exercising regularly, uses a step machine at home twice a day, also walks. She reports NYHA class I dyspnea. She does tell me that she has been under increased stress due to illness in her husband. She has had more palpitations in the setting.  We discussed her medications. Cardiac regimen includes aspirin, Coreg, lisinopril, Pravachol, and as needed nitroglycerin. She reports regular lipid follow-up with [facility].  ECG shows sinus bradycardia.  Past Medical History  Diagnosis Date  . Essential hypertension, benign   . Depression with anxiety   . Non-ST elevation MI (NSTEMI) (HCC)     Ulcerated plaque mid LAD 2/12 - managed medically  . Coronary atherosclerosis of native coronary artery     Subtotally occluded distal LAD  . Mitral valve prolapse 1995    Current Outpatient Prescriptions  Medication Sig Dispense Refill  . acetaminophen (TYLENOL) 325 MG tablet Take 325 mg by mouth every 6 (six) hours as needed.    Marland Kitchen aspirin 81 MG EC tablet Take 81 mg by mouth daily.      . carvedilol (COREG) 6.25 MG tablet TAKE ONE TABLET BY MOUTH TWICE DAILY 180 tablet 2  . clonazePAM (KLONOPIN) 1 MG tablet Take 0.25-1 mg by mouth 3 (three) times daily as needed for anxiety.     Marland Kitchen lisinopril (PRINIVIL,ZESTRIL) 5 MG tablet TAKE ONE TABLET BY MOUTH ONCE DAILY 90 tablet 2  . Multiple Vitamin (MULTIVITAMIN WITH MINERALS) TABS tablet Take 1 tablet by mouth daily.    . nitroGLYCERIN (NITROSTAT) 0.4 MG SL tablet Place  1 tablet (0.4 mg total) under the tongue every 5 (five) minutes as needed. 100 tablet 3  . Omega-3 Fatty Acids (FISH OIL) 1000 MG CAPS Take 1 capsule by mouth daily.      Marland Kitchen PARoxetine (PAXIL) 20 MG tablet Take 40 mg by mouth daily.    . polycarbophil (FIBERCON) 625 MG tablet Take 625 mg by mouth daily.      . pravastatin (PRAVACHOL) 40 MG tablet Take 1 tablet (40 mg total) by mouth every evening. 90 tablet 3   No current facility-administered medications for this visit.   Allergies:  Review of patient's allergies indicates no known allergies.   Social History: The patient  reports that she has never smoked. She has never used smokeless tobacco. She reports that she does not drink alcohol or use illicit drugs.   ROS:  Please see the history of present illness. Otherwise, complete review of systems is positive for occasional palpitations, no syncope..  All other systems are reviewed and negative.   Physical Exam: VS:  BP 90/68 mmHg  Pulse 63  Ht  (1.6 m)  Wt 128 lb (58.06 kg)  BMI 22.68 kg/m2  SpO2 97%  LMP 12/27/2010, BMI Body mass index is 22.68 kg/(m^2).  Wt Readings from Last 3 Encounters:  03/15/15 128 lb (58.06 kg)  12/21/13 134 lb (60.782 kg)  10/31/13 131 lb (59.421 kg)    Normally nourished appearing woman, appears comfortable at rest..  HEENT: Conjunctiva and lids normal, oropharynx clear with moist mucosa.  Neck: Supple, no elevated JVP or carotid bruits, no thyromegaly.  Lungs: Clear to auscultation, nonlabored breathing at rest.  Cardiac: Regular rate and rhythm, no S3 or significant systolic murmur, no pericardial rub.  Abdomen: Soft, nontender, bowel sounds present, no guarding or rebound.  Extremities: No pitting edema, distal pulses 2+.   ECG: ECG is ordered today.  Recent Labwork: No results found for requested labs within last 365 days.     Component Value Date/Time   CHOL 127 12/21/2013 0853   TRIG 47 12/21/2013 0853   HDL 58 12/21/2013 0853     CHOLHDL 2.2 12/21/2013 0853   VLDL 9 12/21/2013 0853   LDLCALC 60 12/21/2013 0853    Assessment and Plan:  1. Symptomatically stable CAD with subtotally occluded distal LAD that has been managed medically. She is doing very well, continue regular exercise plan and medical therapy. ECG reviewed.  2. History of mitral valve prolapse and palpitations. She has had some increase with recent stress. Continues on beta blocker.  3. Essential hypertension, blood pressure low normal today. Patient asymptomatic. Continue to follow with PCP. Could consider cutting back lisinopril dose if needed.  Current medicines were reviewed with the patient today.   Orders Placed This Encounter  Procedures  . EKG 12-Lead    Disposition: FU with me in 1 year.   Signed, Jonelle Sidle, MD, [facility] 03/15/2015 11:19 AM    [facility] at [facility] [address], [place], [place] [address] Phone: [phone]; Fax: [phone]

## 2015-03-15 NOTE — Patient Instructions (Signed)
Your physician wants you to follow-up in: 1 Year with Dr. McDowell. You will receive a reminder letter in the mail two months in advance. If you don't receive a letter, please call our office to schedule the follow-up appointment.  Your physician recommends that you continue on your current medications as directed. Please refer to the Current Medication list given to you today.  If you need a refill on your cardiac medications before your next appointment, please call your pharmacy.  Thank you for choosing [facility]!   

## 2015-06-28 ENCOUNTER — Other Ambulatory Visit: Payer: Self-pay | Admitting: Cardiology

## 2015-10-23 ENCOUNTER — Other Ambulatory Visit: Payer: Self-pay | Admitting: Cardiology

## 2016-01-25 ENCOUNTER — Other Ambulatory Visit: Payer: Self-pay | Admitting: Cardiology

## 2016-02-21 ENCOUNTER — Other Ambulatory Visit: Payer: Self-pay

## 2016-02-21 MED ORDER — PRAVASTATIN SODIUM 40 MG PO TABS: 40.0000 mg | ORAL_TABLET | Freq: Every evening | ORAL | 3 refills | Status: DC

## 2016-03-25 NOTE — Progress Notes (Signed)
Cardiology Office Note  Date: 03/26/2016   ID: Erika Harrison, MRN [id number]  PCP: Erika RibasGOLDING, JOHN CABOT, MD  Primary Cardiologist: Erika DellSamuel Mykal Batiz, MD   Chief Complaint  Patient presents with  . Coronary Artery Disease    History of Present Illness: Erika Harrison is a 61 y.o. female last seen in January 2017. She presents for a routine follow-up visit. Reports infrequent use of nitroglycerin in the interim. [place] was [place] in [place] for chest pain last April and at that time did not have evidence of ACS, had an exercise echocardiogram that was negative for ischemia.  She reports a shocklike sensation in her left arm that sounds neuropathic and she reports being aware of having cervical disc problems. Her left arm pains are nonexertional.  I reviewed her medications which are outlined below. Cardiac regimen includes aspirin, Coreg, lisinopril, Pravachol, and as needed nitroglycerin. She is due for a follow-up lipid panel.   Past Medical History:  Diagnosis Date  . Coronary atherosclerosis of native coronary artery    Subtotally occluded distal LAD  . Depression with anxiety   . Essential hypertension, benign   . Mitral valve prolapse 1995  . Non-ST elevation MI (NSTEMI) (HCC)    Ulcerated plaque mid LAD 2/12 - managed medically    Past Surgical History:  Procedure Laterality Date  . Laparoscopic abdominal surgery    . Right breast surgery      Current Outpatient Prescriptions  Medication Sig Dispense Refill  . acetaminophen (TYLENOL) 325 MG tablet Take 325 mg by mouth every 6 (six) hours as needed.    Marland Kitchen. aspirin 81 MG EC tablet Take 81 mg by mouth daily.      . carvedilol (COREG) 6.25 MG tablet TAKE ONE TABLET BY MOUTH TWICE DAILY 180 tablet 4  . clonazePAM (KLONOPIN) 1 MG tablet Take 0.25-1 mg by mouth 3 (three) times daily as needed for anxiety.     Marland Kitchen. lisinopril (PRINIVIL,ZESTRIL) 5 MG tablet TAKE ONE TABLET BY MOUTH ONCE DAILY 90 tablet 2   . Multiple Vitamin (MULTIVITAMIN WITH MINERALS) TABS tablet Take 1 tablet by mouth daily.    . nitroGLYCERIN (NITROSTAT) 0.4 MG SL tablet Place 1 tablet (0.4 mg total) under the tongue every 5 (five) minutes as needed. 100 tablet 3  . Omega-3 Fatty Acids (FISH OIL) 1000 MG CAPS Take 1 capsule by mouth daily.      Marland Kitchen. PARoxetine (PAXIL) 20 MG tablet Take 40 mg by mouth daily.    . polycarbophil (FIBERCON) 625 MG tablet Take 625 mg by mouth daily.      . pravastatin (PRAVACHOL) 40 MG tablet Take 1 tablet (40 mg total) by mouth every evening. 90 tablet 3   No current facility-administered medications for this visit.    Allergies:  Patient has no known allergies.   Social History: The patient  reports that she has never smoked. She has never used smokeless tobacco. She reports that she does not drink alcohol or use drugs.   ROS:  Please see the history of present illness. Otherwise, complete review of systems is positive for none.  All other systems are reviewed and negative.   Physical Exam: VS:  BP 98/60   Pulse 68   Ht 5\' 3"  (1.6 m)   Wt 129 lb (58.5 kg)   LMP 12/27/2010   SpO2 98%   BMI 22.85 kg/m , BMI Body mass index is 22.85 kg/m.  Wt Readings from Last 3 Encounters:  03/26/16 129 lb (58.5 kg)  03/15/15 128 lb (58.1 kg)  12/21/13 134 lb (60.8 kg)    Normally nourished appearing woman, appears comfortable at rest..  HEENT: Conjunctiva and lids normal, oropharynx clear with moist mucosa.  Neck: Supple, no elevated JVP or carotid bruits, no thyromegaly.  Lungs: Clear to auscultation, nonlabored breathing at rest.  Cardiac: Regular rate and rhythm, no S3 or significant systolic murmur, no pericardial rub.  Abdomen: Soft, nontender, bowel sounds present, no guarding or rebound.  Extremities: No pitting edema, distal pulses 2+. Skin: Warm and dry. Musculoskeletal: No kyphosis. Neuropsychiatric: Alert and oriented 3, affect appropriate.   ECG: I personally reviewed the  tracing from 03/15/2015 which showed sinus bradycardia.  Recent Labwork:    Component Value Date/Time   CHOL 127 12/21/2013 0853   TRIG 47 12/21/2013 0853   HDL 58 12/21/2013 0853   CHOLHDL 2.2 12/21/2013 0853   VLDL 9 12/21/2013 0853   LDLCALC 60 12/21/2013 0853    Other Studies Reviewed Today:  Exercise echocardiogram 06/24/2015 [facility]): Patient achieved 11.7 metastases with adequate heart rate response. No diagnostic ST segment changes, rare PVCs. No echocardiographic evidence of ischemia.  Assessment and Plan:  1. CAD with history of subtotally occluded distal LAD that is being managed medically. She is symptomatically stable in terms of angina and had a negative exercise echocardiogram for ischemia in April 2017 in [place]. Continue with current medications.  2. Essential hypertension, low-normal blood pressure today. Patient is asymptomatic.  3. History of mitral valve prolapse, asymptomatic.  4. Hyperlipidemia, continues on Pravachol. Will recheck fasting lipid panel.  Current medicines were reviewed with the patient today.   Orders Placed This Encounter  Procedures  . Lipid Profile    Disposition: Follow-up in one year.  Signed, Erika Sidle, MD, [facility] 03/26/2016 11:10 AM     [facility] at [facility] [address] [place], [place] [address] Phone: [phone]; Fax: [phone]

## 2016-03-26 ENCOUNTER — Ambulatory Visit (INDEPENDENT_AMBULATORY_CARE_PROVIDER_SITE_OTHER): Payer: Self-pay | Admitting: Cardiology

## 2016-03-26 ENCOUNTER — Encounter: Payer: Self-pay | Admitting: Cardiology

## 2016-03-26 VITALS — BP 98/60 | HR 68 | Ht 63.0 in | Wt 129.0 lb

## 2016-03-26 DIAGNOSIS — I2510 Atherosclerotic heart disease of native coronary artery without angina pectoris: Secondary | ICD-10-CM

## 2016-03-26 DIAGNOSIS — E782 Mixed hyperlipidemia: Secondary | ICD-10-CM

## 2016-03-26 DIAGNOSIS — I10 Essential (primary) hypertension: Secondary | ICD-10-CM

## 2016-03-26 DIAGNOSIS — I341 Nonrheumatic mitral (valve) prolapse: Secondary | ICD-10-CM

## 2016-03-26 NOTE — Patient Instructions (Signed)
Your physician wants you to follow-up in:  1 year with Dr.McDowell You will receive a reminder letter in the mail two months in advance. If you don't receive a letter, please call our office to schedule the follow-up appointment.    Your physician recommends that you return for lab work in: FASTING Lipids     Your physician recommends that you continue on your current medications as directed. Please refer to the Current Medication list given to you today.     If you need a refill on your cardiac medications before your next appointment, please call your pharmacy.   Thank you for choosing [facility] !

## 2016-04-03 LAB — LIPID PANEL
CHOLESTEROL TOTAL: 138 mg/dL (ref 100–199)
Chol/HDL Ratio: 2.2 ratio units (ref 0.0–4.4)
HDL: 63 mg/dL (ref >39)
LDL Calculated: 65 mg/dL (ref 0–99)
Triglycerides: 52 mg/dL (ref 0–149)
VLDL Cholesterol Cal: 10 mg/dL (ref 5–40)

## 2016-07-15 ENCOUNTER — Other Ambulatory Visit: Payer: Self-pay | Admitting: Cardiology

## 2016-11-04 ENCOUNTER — Other Ambulatory Visit: Payer: Self-pay | Admitting: Cardiology

## 2017-02-28 NOTE — Progress Notes (Signed)
Cardiology Office Note  Date: 03/04/2017   ID: Erika Harrison, DOB 02/18/1956, MRN [id number]  PCP: Assunta FoundGolding, John, MD  Primary Cardiologist: Nona DellSamuel Brayam Boeke, MD   Chief Complaint  Patient presents with  . Coronary Artery Disease    History of Present Illness: Erika Harrison is a 62 y.o. female last seen in January 2018. She presents for a routine follow-up visit. Over the last year she does not report any problems with exertional chest pain or unusual shortness of breath. She still exercises by walking. She reports NYHA class 1-2 dyspnea.  I reviewed her current medications which are outlined below and stable from a cardiac perspective. [facility]. She does not report any intolerances. No nitroglycerin use.  I personally reviewed her ECG today which shows sinus rhythm with nonspecific T-wave changes.  Past Medical History:  Diagnosis Date  . Coronary atherosclerosis of native coronary artery    Subtotally occluded distal LAD  . Depression with anxiety   . Essential hypertension, benign   . Mitral valve prolapse 1995  . Non-ST elevation MI (NSTEMI) (HCC)    Ulcerated plaque mid LAD 2/12 - managed medically    Past Surgical History:  Procedure Laterality Date  . Laparoscopic abdominal surgery    . Right breast surgery      Current Outpatient Medications  Medication Sig Dispense Refill  . acetaminophen (TYLENOL) 325 MG tablet Take 325 mg by mouth every 6 (six) hours as needed.    Marland Kitchen. aspirin 81 MG EC tablet Take 81 mg by mouth daily.      . carvedilol (COREG) 6.25 MG tablet TAKE ONE TABLET BY MOUTH TWICE DAILY 180 tablet 4  . clonazePAM (KLONOPIN) 1 MG tablet Take 0.25-1 mg by mouth 3 (three) times daily as needed for anxiety.     Marland Kitchen. lisinopril (PRINIVIL,ZESTRIL) 5 MG tablet TAKE ONE TABLET BY MOUTH ONCE DAILY 90 tablet 2  . Multiple Vitamin (MULTIVITAMIN WITH MINERALS) TABS tablet Take 1 tablet by mouth daily.    .  nitroGLYCERIN (NITROSTAT) 0.4 MG SL tablet Place 1 tablet (0.4 mg total) under the tongue every 5 (five) minutes as needed. 100 tablet 3  . Omega-3 Fatty Acids (FISH OIL) 1000 MG CAPS Take 1 capsule by mouth daily.      Marland Kitchen. PARoxetine (PAXIL) 20 MG tablet Take 40 mg by mouth daily.    . polycarbophil (FIBERCON) 625 MG tablet Take 625 mg by mouth daily.      . pravastatin (PRAVACHOL) 40 MG tablet Take 1 tablet (40 mg total) by mouth every evening. 90 tablet 3   No current facility-administered medications for this visit.    Allergies:  Patient has no known allergies.   Social History: The patient  reports that  has never smoked. she has never used smokeless tobacco. She reports that she does not drink alcohol or use drugs.   ROS:  Please see the history of present illness. Otherwise, complete review of systems is positive for none.  All other systems are reviewed and negative.   Physical Exam: VS:  BP 108/64   Pulse 78   Ht 5\' 3"  (1.6 m)   Wt 126 lb (57.2 kg)   LMP 12/27/2010   SpO2 98%   BMI 22.32 kg/m , BMI Body mass index is 22.32 kg/m.  Wt Readings from Last 3 Encounters:  03/04/17 126 lb (57.2 kg)  03/26/16 129 lb (58.5 kg)  03/15/15 128 lb (58.1  kg)    General: Patient appears comfortable at rest. HEENT: Conjunctiva and lids normal, oropharynx clear. Neck: Supple, no elevated JVP or carotid bruits, no thyromegaly. Lungs: Clear to auscultation, nonlabored breathing at rest. Cardiac: Regular rate and rhythm, no S3 or significant systolic murmur, no pericardial rub. Abdomen: Soft, nontender, bowel sounds present. Extremities: No pitting edema, distal pulses 2+. Skin: Warm and dry. Musculoskeletal: No kyphosis. Neuropsychiatric: Alert and oriented x3, affect grossly appropriate.  ECG: I personally reviewed the tracing from 03/15/2015 which showed sinus bradycardia.  Recent Labwork:    Component Value Date/Time   CHOL 138 04/02/2016 1035   TRIG 52 04/02/2016 1035   HDL 63  04/02/2016 1035   CHOLHDL 2.2 04/02/2016 1035   CHOLHDL 2.2 12/21/2013 0853   VLDL 9 12/21/2013 0853   LDLCALC 65 04/02/2016 1035    Other Studies Reviewed Today:  Exercise echocardiogram 06/24/2015 [facility]): Patient achieved 11.7 metastases with adequate heart rate response. No diagnostic ST segment changes, rare PVCs. No echocardiographic evidence of ischemia.  Assessment and Plan:  1. CAD with previously documented subtotal occlusion of the distal LAD that is being managed medically. She remains quite stable without angina symptoms, ECG reviewed as noted. Continue medical therapy and observation. She continues to exercise regularly.  2. Mixed hyperlipidemia, remains on Pravachol with follow-up per [place]. LDL was 65 last year.  3. Essential hypertension, blood pressure well controlled today. She continues on Coreg and lisinopril.  4. Mitral valve prolapse, asymptomatic.  Current medicines were reviewed with the patient today.   Orders Placed This Encounter  Procedures  . EKG 12-Lead    Disposition: Follow-up in one year.  Signed, Jonelle Sidle, MD, [facility] 03/04/2017 1:13 PM    [facility] at [facility] [address], [place], [place] [address] Phone: [phone]; Fax: [phone]

## 2017-03-04 ENCOUNTER — Other Ambulatory Visit: Payer: Self-pay | Admitting: Cardiology

## 2017-03-04 ENCOUNTER — Ambulatory Visit (INDEPENDENT_AMBULATORY_CARE_PROVIDER_SITE_OTHER): Payer: Self-pay | Admitting: Cardiology

## 2017-03-04 ENCOUNTER — Encounter: Payer: Self-pay | Admitting: Cardiology

## 2017-03-04 VITALS — BP 108/64 | HR 78 | Ht 63.0 in | Wt 126.0 lb

## 2017-03-04 DIAGNOSIS — I2510 Atherosclerotic heart disease of native coronary artery without angina pectoris: Secondary | ICD-10-CM

## 2017-03-04 DIAGNOSIS — I341 Nonrheumatic mitral (valve) prolapse: Secondary | ICD-10-CM

## 2017-03-04 DIAGNOSIS — I10 Essential (primary) hypertension: Secondary | ICD-10-CM

## 2017-03-04 DIAGNOSIS — E782 Mixed hyperlipidemia: Secondary | ICD-10-CM

## 2017-03-04 NOTE — Patient Instructions (Addendum)
Your physician wants you to follow-up in: 1 year with Dr.McDowell You will receive a reminder letter in the mail two months in advance. If you don't receive a letter, please call our office to schedule the follow-up appointment.    Your physician recommends that you continue on your current medications as directed. Please refer to the Current Medication list given to you today.    If you need a refill on your cardiac medications before your next appointment, please call your pharmacy.     No lab work or tests ordered today.      Thank you for choosing [facility] !        

## 2017-03-27 ENCOUNTER — Other Ambulatory Visit: Payer: Self-pay | Admitting: Cardiology

## 2017-08-21 ENCOUNTER — Telehealth: Payer: Self-pay

## 2017-08-21 NOTE — Telephone Encounter (Signed)
No additional encounter notes. -[facility] 

## 2017-12-15 ENCOUNTER — Ambulatory Visit (INDEPENDENT_AMBULATORY_CARE_PROVIDER_SITE_OTHER): Payer: Self-pay | Admitting: *Deleted

## 2017-12-15 ENCOUNTER — Telehealth: Payer: Self-pay | Admitting: *Deleted

## 2017-12-15 VITALS — BP 114/60 | HR 63 | Ht 63.0 in | Wt 130.0 lb

## 2017-12-15 DIAGNOSIS — R002 Palpitations: Secondary | ICD-10-CM

## 2017-12-15 NOTE — Telephone Encounter (Signed)
-----   Message from Jonelle Sidle, MD sent at 12/15/2017  9:48 AM EDT -----   ----- Message ----- From: Kerney Elbe, LPN Sent: [id number]/[id number]   9:39 AM EDT To: Jonelle Sidle, MD

## 2017-12-15 NOTE — Progress Notes (Signed)
Pt walked in office c/o "pounding" in her chest. States that it starts in her chest and move up to her head and makes her lightheaded. Onset today was about 0800. Pt states that she had to lay in the floor b/c she felt like she was going to pass out. Denies nausea at that time. Reports that this episode last for about 10 sec. She reports that after lying on the floor the the dizziness, and palpitations went away but she now feels drained and weak. Pt c/o being drained and tired at this time. Pt states she is having this episodes 3 times a month.

## 2017-12-15 NOTE — Progress Notes (Signed)
Pt notified of plan of care and agrees.  

## 2017-12-15 NOTE — Progress Notes (Signed)
I reviewed her ECG which shows sinus bradycardia.  Etiology of symptoms is not clear based on description, but would be concerned about potential arrhythmia or ectopy.  If she would like to investigate this further, recommend 30-day event recorder.

## 2017-12-15 NOTE — Addendum Note (Signed)
Addended by: Kerney Elbe on: 12/15/2017 10:06 AM   Modules accepted: Orders

## 2017-12-17 ENCOUNTER — Telehealth: Payer: Self-pay | Admitting: Cardiology

## 2017-12-17 ENCOUNTER — Ambulatory Visit (INDEPENDENT_AMBULATORY_CARE_PROVIDER_SITE_OTHER): Payer: Self-pay

## 2017-12-17 DIAGNOSIS — R002 Palpitations: Secondary | ICD-10-CM

## 2017-12-17 NOTE — Telephone Encounter (Signed)
Called pt. She asked if her monitor would arrive soon. I informed her that Ii would call and see about it. She voiced appreciation. I sent email to [employer] rep.

## 2017-12-17 NOTE — Telephone Encounter (Signed)
Please give pt a call-- she has questions concerning her monitor

## 2017-12-22 ENCOUNTER — Other Ambulatory Visit: Payer: Self-pay | Admitting: Cardiology

## 2017-12-22 MED ORDER — NITROGLYCERIN 0.4 MG SL SUBL: 0.4000 mg | SUBLINGUAL_TABLET | SUBLINGUAL | 3 refills | Status: DC | PRN

## 2017-12-22 NOTE — Telephone Encounter (Signed)
°*  STAT* If patient is at the pharmacy, call can be transferred to refill team.   1. Which medications need to be refilled?  nitroGLYCERIN (NITROSTAT) 0.4 MG SL tablet    2. Which pharmacy/location (including street and city if local pharmacy) is medication to be sent to? [facility] in [place]  3. Do they need a 30 day or 90 day supply?

## 2017-12-22 NOTE — Telephone Encounter (Signed)
refilled ntg per request

## 2018-01-05 ENCOUNTER — Telehealth: Payer: Self-pay | Admitting: Cardiology

## 2018-01-05 NOTE — Telephone Encounter (Signed)
Re-assured patient that [employer] had not registered anything other than her baseline.Pt happy, without sx's now

## 2018-01-05 NOTE — Telephone Encounter (Signed)
Per pt -- she left voicemail stating she had another spell where she felt like she's going to pass out again. She is still wearing the monitor and she's not sure what she needs to do.   Please give her a call

## 2018-01-06 ENCOUNTER — Telehealth: Payer: Self-pay | Admitting: *Deleted

## 2018-01-06 NOTE — Telephone Encounter (Signed)
Pt called office unset stating that she received bill from event monitor for $1700. Mitch from [employer] called and states that if pt calls and speaks with billing she will only pay $350. Pt notified and voiced understanding.

## 2018-01-21 ENCOUNTER — Telehealth: Payer: Self-pay | Admitting: *Deleted

## 2018-01-21 ENCOUNTER — Telehealth: Payer: Self-pay

## 2018-01-21 DIAGNOSIS — R002 Palpitations: Secondary | ICD-10-CM

## 2018-01-21 DIAGNOSIS — I10 Essential (primary) hypertension: Secondary | ICD-10-CM

## 2018-01-21 NOTE — Telephone Encounter (Signed)
No answer, left message for pt to return call. 

## 2018-01-21 NOTE — Telephone Encounter (Signed)
-----   Message from Jonelle SidleSamuel G McDowell, MD sent at 01/21/2018 10:39 AM EST ----- Results reviewed.  There were no sustained arrhythmias or pauses although she did have occasional PACs and PVCs which she could be feeling as palpitations.  Brief episode of NSVT may have also been associated with lightheadedness, but again no sustained events.  Please obtain an echocardiogram to ensure that LVEF is normal and arrange office follow-up visit if not already scheduled. A copy of this test should be forwarded to Assunta FoundGolding, John, MD.

## 2018-01-21 NOTE — Telephone Encounter (Signed)
-----   Message from Samuel G McDowell, MD sent at 01/21/2018 10:39 AM EST ----- Results reviewed.  There were no sustained arrhythmias or pauses although she did have occasional PACs and PVCs which she could be feeling as palpitations.  Brief episode of NSVT may have also been associated with lightheadedness, but again no sustained events.  Please obtain an echocardiogram to ensure that LVEF is normal and arrange office follow-up visit if not already scheduled. A copy of this test should be forwarded to Golding, John, MD. 

## 2018-01-30 ENCOUNTER — Ambulatory Visit (HOSPITAL_COMMUNITY)
Admission: RE | Admit: 2018-01-30 | Discharge: 2018-01-30 | Disposition: A | Discharge status: Home / Self Care | Payer: Self-pay | Source: Ambulatory Visit | Attending: Cardiology | Admitting: Cardiology

## 2018-01-30 DIAGNOSIS — R002 Palpitations: Secondary | ICD-10-CM | POA: Insufficient documentation

## 2018-01-30 DIAGNOSIS — I10 Essential (primary) hypertension: Secondary | ICD-10-CM | POA: Insufficient documentation

## 2018-01-30 NOTE — Progress Notes (Signed)
*  PRELIMINARY RESULTS* Echocardiogram 2D Echocardiogram has been performed.  Jeryl Columbialliott, Mireille Lacombe 01/30/2018, 11:11 AM

## 2018-02-02 ENCOUNTER — Telehealth: Payer: Self-pay | Admitting: *Deleted

## 2018-02-02 NOTE — Telephone Encounter (Signed)
-----   Message from Jonelle SidleSamuel G McDowell, MD sent at 01/30/2018 12:13 PM EST ----- Results reviewed.  LVEF is normal at 55 to 60%.  Reassuring with monitor results showing PACs, PVCs, and brief NSVT.  Can go over further at office follow-up. A copy of this test should be forwarded to Assunta FoundGolding, John, MD.

## 2018-02-02 NOTE — Telephone Encounter (Signed)
Patient informed. Copy sent to PCP °

## 2018-03-04 ENCOUNTER — Encounter: Payer: Self-pay | Admitting: Cardiology

## 2018-03-04 ENCOUNTER — Other Ambulatory Visit: Payer: Self-pay

## 2018-03-04 MED ORDER — PRAVASTATIN SODIUM 40 MG PO TABS: 40.0000 mg | ORAL_TABLET | Freq: Every evening | ORAL | 3 refills | Status: DC

## 2018-03-04 MED ORDER — CARVEDILOL 6.25 MG PO TABS: 6.2500 mg | ORAL_TABLET | Freq: Two times a day (BID) | ORAL | 3 refills | Status: DC

## 2018-03-19 ENCOUNTER — Encounter: Payer: Self-pay | Admitting: Cardiology

## 2018-03-19 ENCOUNTER — Ambulatory Visit (INDEPENDENT_AMBULATORY_CARE_PROVIDER_SITE_OTHER): Payer: Self-pay | Admitting: Cardiology

## 2018-03-19 VITALS — BP 124/74 | HR 61 | Ht 63.0 in | Wt 130.0 lb

## 2018-03-19 DIAGNOSIS — I10 Essential (primary) hypertension: Secondary | ICD-10-CM

## 2018-03-19 DIAGNOSIS — I25119 Atherosclerotic heart disease of native coronary artery with unspecified angina pectoris: Secondary | ICD-10-CM

## 2018-03-19 DIAGNOSIS — E782 Mixed hyperlipidemia: Secondary | ICD-10-CM

## 2018-03-19 DIAGNOSIS — R002 Palpitations: Secondary | ICD-10-CM

## 2018-03-19 MED ORDER — LISINOPRIL 5 MG PO TABS: 5.0000 mg | ORAL_TABLET | Freq: Every day | ORAL | 3 refills | Status: DC

## 2018-03-19 NOTE — Progress Notes (Signed)
Cardiology Office Note  Date: 03/19/2018   ID: Erika Harrison, DOB 04/23/1955, MRN [id number]  PCP: Assunta FoundGolding, John, MD  Primary Cardiologist: Nona DellSamuel McDowell, MD   Chief Complaint  Patient presents with  . Coronary Artery Disease    History of Present Illness: Erika Harrison is a 63 y.o. female last seen in January 2019.  She is here for a follow-up visit.  She does not report any angina symptoms at this time, no nitroglycerin use, stable functional capacity.  She still has intermittent palpitations but has had no syncope. Cardiac monitor and echocardiogram from late 2019 are outlined below.  We have continued beta-blocker in the form of Coreg.  I reviewed her interval ECG from October 2019.  We are requesting her lab work from [facility].  Current cardiac regimen includes aspirin, Coreg, lisinopril, as needed nitroglycerin, and Pravachol.  Past Medical History:  Diagnosis Date  . Coronary atherosclerosis of native coronary artery    Subtotally occluded distal LAD  . Depression with anxiety   . Essential hypertension, benign   . Mitral valve prolapse 1995  . Non-ST elevation MI (NSTEMI) (HCC)    Ulcerated plaque mid LAD 2/12 - managed medically    Past Surgical History:  Procedure Laterality Date  . Laparoscopic abdominal surgery    . Right breast surgery      Current Outpatient Medications  Medication Sig Dispense Refill  . acetaminophen (TYLENOL) 325 MG tablet Take 325 mg by mouth every 6 (six) hours as needed.    Marland Kitchen. aspirin 81 MG EC tablet Take 81 mg by mouth daily.      . carvedilol (COREG) 6.25 MG tablet Take 1 tablet (6.25 mg total) by mouth 2 (two) times daily. 180 tablet 3  . clonazePAM (KLONOPIN) 1 MG tablet Take 0.25-1 mg by mouth 3 (three) times daily as needed for anxiety.     Marland Kitchen. lisinopril (PRINIVIL,ZESTRIL) 5 MG tablet TAKE 1 TABLET BY MOUTH ONCE DAILY 90 tablet 3  . Multiple Vitamin (MULTIVITAMIN WITH MINERALS) TABS tablet Take 1 tablet by mouth  daily.    . nitroGLYCERIN (NITROSTAT) 0.4 MG SL tablet Place 1 tablet (0.4 mg total) under the tongue every 5 (five) minutes as needed. 100 tablet 3  . Omega-3 Fatty Acids (FISH OIL) 1000 MG CAPS Take 1 capsule by mouth daily.      Marland Kitchen. PARoxetine (PAXIL) 20 MG tablet Take 40 mg by mouth daily.    . polycarbophil (FIBERCON) 625 MG tablet Take 625 mg by mouth daily.      . pravastatin (PRAVACHOL) 40 MG tablet Take 1 tablet (40 mg total) by mouth every evening. 90 tablet 3   No current facility-administered medications for this visit.    Allergies:  Patient has no known allergies.   Social History: The patient  reports that she has never smoked. She has never used smokeless tobacco. She reports that she does not drink alcohol or use drugs.   ROS:  Please see the history of present illness. Otherwise, complete review of systems is positive for none.  All other systems are reviewed and negative.   Physical Exam: VS:  BP 124/74 (BP Location: Right Arm)   Pulse 61   Ht 5\' 3"  (1.6 m)   Wt 130 lb (59 kg)   LMP 12/27/2010   SpO2 98%   BMI 23.03 kg/m , BMI Body mass index is 23.03 kg/m.  Wt Readings from Last 3 Encounters:  03/19/18 130 lb (59 kg)  12/15/17  130 lb (59 kg)  03/04/17 126 lb (57.2 kg)    General: Patient appears comfortable at rest. HEENT: Conjunctiva and lids normal, oropharynx clear. Neck: Supple, no elevated JVP or carotid bruits, no thyromegaly. Lungs: Clear to auscultation, nonlabored breathing at rest. Cardiac: Regular rate and rhythm, no S3 or significant systolic murmur. Abdomen: Soft, nontender, bowel sounds present. Extremities: No pitting edema, distal pulses 2+. Skin: Warm and dry. Musculoskeletal: No kyphosis. Neuropsychiatric: Alert and oriented x3, affect grossly appropriate.   ECG: I personally reviewed the tracing from 12/15/2017 which showed sinus bradycardia.  Recent Labwork:    Component Value Date/Time   CHOL 138 04/02/2016 1035   TRIG 52  04/02/2016 1035   HDL 63 04/02/2016 1035   CHOLHDL 2.2 04/02/2016 1035   CHOLHDL 2.2 12/21/2013 0853   VLDL 9 12/21/2013 0853   LDLCALC 65 04/02/2016 1035    Other Studies Reviewed Today:  Echocardiogram 01/30/2018: Study Conclusions  - Left ventricle: The cavity size was normal. Wall thickness was   normal. Systolic function was normal. The estimated ejection   fraction was in the range of 55% to 60%. Wall motion was normal;   there were no regional wall motion abnormalities. Indeterminate   diastolic function. - Aortic valve: There was mild regurgitation. - Mitral valve: There was trivial regurgitation. - Right atrium: Central venous pressure (est): 3 mm Hg. - Atrial septum: No defect or patent foramen ovale was identified. - Tricuspid valve: There was mild regurgitation. - Pulmonary arteries: PA peak pressure: 21 mm Hg (S). - Pericardium, extracardiac: There was no pericardial effusion.  Event monitor 12/18/2017: Available strips from 30-day event monitor reviewed.  Sinus rhythm is present throughout.  There were occasional PACs and PVCs noted, one 5 beat burst of NSVT on November 11 (no syncope).  Also junctional rhythm at 60 bpm noted on November 18.  Heart rate ranged from 47 bpm up to 127 bpm with average heart rate 66 bpm.  There were no sustained arrhythmias or pauses.  Assessment and Plan:  1.  CAD with subtotally occluded distal LAD that has been managed medically.  She reports no active angina symptoms.  Continue aspirin and statin therapy.  2.  Palpitations, stable at this time.  No associated syncope.  She did have PACs and PVCs by recent event recorder, also brief burst of NSVT but normal LVEF by echocardiogram.  Plan to continue beta-blocker in the form of Coreg at this time.  3.  Essential hypertension, blood pressure is well controlled today.  No changes made in antihypertensive regimen.  4.  Mixed hyperlipidemia, on Pravachol.  Requesting lab work from  [place].  Current medicines were reviewed with the patient today.  Disposition: Follow-up in 6 months.  Signed, Jonelle Sidle, MD, [facility] 03/19/2018 8:27 AM    [facility] at [facility] [address], [place], [place] [address] Phone: [phone]; Fax: [phone]

## 2018-03-19 NOTE — Addendum Note (Signed)
Addended by: Marlyn Corporal A on: 03/19/2018 08:28 AM   Modules accepted: Orders

## 2018-03-19 NOTE — Patient Instructions (Signed)
Medication Instructions:  Your physician recommends that you continue on your current medications as directed. Please refer to the Current Medication list given to you today.  If you need a refill on your cardiac medications before your next appointment, please call your pharmacy.   Lab work: None today If you have labs (blood work) drawn today and your tests are completely normal, you will receive your results only by: Marland Kitchen MyChart Message (if you have MyChart) OR . A paper copy in the mail If you have any lab test that is abnormal or we need to change your treatment, we will call you to review the results.  Testing/Procedures: None today  Follow-Up: At [facility], you and your health needs are our priority.  As part of our continuing mission to provide you with exceptional heart care, we have created designated Provider Care Teams.  These Care Teams include your primary Cardiologist (physician) and Advanced Practice Providers (APPs -  Physician Assistants and Nurse Practitioners) who all work together to provide you with the care you need, when you need it. You will need a follow up appointment in 6 months.  Please call our office 2 months in advance to schedule this appointment.  You may see No primary care provider on file. or one of the following Advanced Practice Providers on your designated Care Team:   [place], PA-C [facility]) . Jacolyn Reedy, PA-C [facility])  Any Other Special Instructions Will Be Listed Below (If Applicable). None

## 2018-08-19 ENCOUNTER — Other Ambulatory Visit: Payer: Self-pay

## 2018-08-19 ENCOUNTER — Ambulatory Visit
Admission: EM | Admit: 2018-08-19 | Discharge: 2018-08-19 | Disposition: A | Discharge status: Home / Self Care | Payer: Self-pay | Attending: Emergency Medicine | Admitting: Emergency Medicine

## 2018-08-19 DIAGNOSIS — T7840XA Allergy, unspecified, initial encounter: Secondary | ICD-10-CM

## 2018-08-19 DIAGNOSIS — T63461A Toxic effect of venom of wasps, accidental (unintentional), initial encounter: Secondary | ICD-10-CM

## 2018-08-19 MED ORDER — PREDNISONE 20 MG PO TABS: 20.0000 mg | ORAL_TABLET | Freq: Two times a day (BID) | ORAL | 0 refills | Status: AC

## 2018-08-19 MED ORDER — FAMOTIDINE 20 MG PO TABS
20.0000 mg | ORAL_TABLET | Freq: Once | ORAL | Status: AC
  Administered 2018-08-19: 20 mg via ORAL

## 2018-08-19 MED ORDER — DEXAMETHASONE SODIUM PHOSPHATE 10 MG/ML IJ SOLN
10.0000 mg | Freq: Once | INTRAMUSCULAR | Status: AC
  Administered 2018-08-19: 10 mg via INTRAMUSCULAR

## 2018-08-19 MED ORDER — FAMOTIDINE 20 MG PO TABS: 20.0000 mg | ORAL_TABLET | Freq: Two times a day (BID) | ORAL | 0 refills | Status: DC

## 2018-08-19 NOTE — ED Provider Notes (Signed)
[facility]   [id number] 08/19/18 Arrival Time: [facility]1502  Cc: Allergic reaction  SUBJECTIVE:  Erika Harrison is a 63 y.o. female hx significant for coronary atherosclerosis, depression with anxiety, HTN, MVP, and h/o NSTEMI, who presents with possible allergic reaction that began 30 minutes.  Symptoms began after being stung on the RT hand by a wasp. Has tried OTC benadryl with relief.  Denies aggravating factors.  Reports facial swelling in the past following wasp sting.  She was treated in the ED at that time which was approximately 12 years ago.   Denies fever, chills, nausea, vomiting, swollen glands, oral manifestations such as throat swelling/ tingling, mouth swelling/ tingling, tongue swelling/tingling, dyspnea, SOB, chest pain, abdominal pain, changes in bowel or bladder function.     ROS: As per HPI.  Past Medical History:  Diagnosis Date  . Coronary atherosclerosis of native coronary artery    Subtotally occluded distal LAD  . Depression with anxiety   . Essential hypertension, benign   . Mitral valve prolapse 1995  . Non-ST elevation MI (NSTEMI) ([facility])    Ulcerated plaque mid LAD 2/12 - managed medically   Past Surgical History:  Procedure Laterality Date  . Laparoscopic abdominal surgery    . Right breast surgery     No Known Allergies No current facility-administered medications on file prior to encounter.    Current Outpatient Medications on File Prior to Encounter  Medication Sig Dispense Refill  . acetaminophen (TYLENOL) 325 MG tablet Take 325 mg by mouth every 6 (six) hours as needed.    Marland Kitchen. aspirin 81 MG EC tablet Take 81 mg by mouth daily.      . carvedilol (COREG) 6.25 MG tablet Take 1 tablet (6.25 mg total) by mouth 2 (two) times daily. 180 tablet 3  . clonazePAM (KLONOPIN) 1 MG tablet Take 0.25-1 mg by mouth 3 (three) times daily as needed for anxiety.     Marland Kitchen. lisinopril (PRINIVIL,ZESTRIL) 5 MG tablet Take 1 tablet (5 mg total) by mouth daily. 90  tablet 3  . Multiple Vitamin (MULTIVITAMIN WITH MINERALS) TABS tablet Take 1 tablet by mouth daily.    . nitroGLYCERIN (NITROSTAT) 0.4 MG SL tablet Place 1 tablet (0.4 mg total) under the tongue every 5 (five) minutes as needed. 100 tablet 3  . Omega-3 Fatty Acids (FISH OIL) 1000 MG CAPS Take 1 capsule by mouth daily.      Marland Kitchen. PARoxetine (PAXIL) 20 MG tablet Take 40 mg by mouth daily.    . polycarbophil (FIBERCON) 625 MG tablet Take 625 mg by mouth daily.      . pravastatin (PRAVACHOL) 40 MG tablet Take 1 tablet (40 mg total) by mouth every evening. 90 tablet 3    Social History   Socioeconomic History  . Marital status: Married    Spouse name: Not on file  . Number of children: Not on file  . Years of education: Not on file  . Highest education level: Not on file  Occupational History  . Not on file  Social Needs  . Financial resource strain: Not on file  . Food insecurity    Worry: Not on file    Inability: Not on file  . Transportation needs    Medical: Not on file    Non-medical: Not on file  Tobacco Use  . Smoking status: Never Smoker  . Smokeless tobacco: Never Used  Substance and Sexual Activity  . Alcohol use: No  . Drug use: No  . Sexual  activity: Not on file  Lifestyle  . Physical activity    Days per week: Not on file    Minutes per session: Not on file  . Stress: Not on file  Relationships  . Social Herbalist on phone: Not on file    Gets together: Not on file    Attends religious service: Not on file    Active member of club or organization: Not on file    Attends meetings of clubs or organizations: Not on file    Relationship status: Not on file  . Intimate partner violence    Fear of current or ex partner: Not on file    Emotionally abused: Not on file    Physically abused: Not on file    Forced sexual activity: Not on file  Other Topics Concern  . Not on file  Social History Narrative               Family History  Problem Relation  Age of Onset  . Cancer Father        Renal cell carcinoma  . Rheum arthritis Brother   . Hypertension Sister   . Drug abuse Brother      OBJECTIVE:  Vitals:   08/19/18 1507  BP: 113/72  Pulse: (!) 59  Resp: 18  Temp: 98 F (36.7 C)  SpO2: 95%     General appearance: Alert, speaking in full sentences without difficulty; tolerating own secretions without difficulty HEENT:NCAT; no obvious facial swelling; Ears: EACs clear; Eyes: PERRL.  EOM grossly intact. Nose: nares patent without rhinorrhea; Throat: oropharynx clear, tonsils nonerythematous or enlarged, uvula midline Neck: supple without LAD Lungs: clear to auscultation bilaterally without adventitious breath sounds; normal respiratory effort; no labored respirations Heart: regular rate and rhythm.  Radial pulses 2+ symmetrical bilaterally; cap refill < 2 seconds Abdomen: soft, nondistended, normal active bowel sounds; nontender to palpation; no guarding  Skin: warm and dry; RT third digit with swelling and mild erythema, mildly TTP; cap refill <2 seconds Psychological: alert and cooperative; normal mood and affect  MDM:  Discussed patient case with Dr. Mannie Stabile. Patient presents with localized swelling to third digit following wasp sting approximately 30 minutes ago.  Hx of facial swelling following wasp sting approximately 12 years ago.  VS stable.  No acute distress.  Pt speaking in full sentences, tolerating own secretions, oropharynx clear, and answering questions appropriately. CV RRR, radial pulse 2+, cap refill <2 seconds, and lungs CTAB.  Abdomen NTTP.  Decadron and famotidine given in office.  Patient reports improvement in symptoms following treatment.  Prednisone and pepcid prescription sent.  Will continue OTC benadryl as instructed.    Patient reevaluated 15-20 minutes after decadron and famotidine given.  Pt continues to report improvement in symptoms.  Denies facial swelling, throat tightness, oral or throat numbness/  tingling, dyspnea, dysphagia, chest pain, SOB, N/V/D.  Released for discharge in stable condition.  Going home with husband and daughter.  Given strict ED precautions and when to call 911.   ASSESSMENT & PLAN:  1. Allergic reaction, initial encounter   2. Wasp sting, accidental or unintentional, initial encounter     Meds ordered this encounter  Medications  . dexamethasone (DECADRON) injection 10 mg  . famotidine (PEPCID) tablet 20 mg  . predniSONE (DELTASONE) 20 MG tablet    Sig: Take 1 tablet (20 mg total) by mouth 2 (two) times daily with a meal for 3 days.    Dispense:  6  tablet    Refill:  0    Order Specific Question:   Supervising Provider    Answer:   Eustace MooreNELSON, YVONNE SUE [[id number]]  . famotidine (PEPCID) 20 MG tablet    Sig: Take 1 tablet (20 mg total) by mouth 2 (two) times daily for 3 days.    Dispense:  6 tablet    Refill:  0    Order Specific Question:   Supervising Provider    Answer:   Eustace MooreELSON, YVONNE SUE [[id number]]   Decadron 10 mg shot given in office.  Reports improvement in symptoms.   Famotidine 20 mg given in office.  Rest push fluids Return or follow up with PCP in 24 hours to be reevaluated and to ensure your symptoms are improving Prednisone 40 mg daily for 3 days prescribed.  Take as directed and to completion. Take Benadryl 25 mg daily for the next 3 days. Pepcid 20 mg twice daily for 3 days Call 911 if you have any new or worsening symptoms such as difficulty breathing, shortness of breath, chest pain, nausea, vomiting, throat tightness or swelling, tongue swelling or tingling, worsening lip or facial swelling, abdominal pain, changes in bowel or bladder habits, no improvement despite medications, etc...   Reviewed expectations re: course of current medical issues. Questions answered. Outlined signs and symptoms indicating need for more acute intervention. Patient verbalized understanding. After Visit Summary given.          Rennis HardingWurst, Riko Lumsden, PA-C  08/19/18 1543

## 2018-08-19 NOTE — ED Triage Notes (Signed)
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

## 2018-08-19 NOTE — Discharge Instructions (Addendum)
Decadron 10 mg shot given in office.  Reports improvement in symptoms.   Famotidine 20 mg given in office.  Rest push fluids Return or follow up with PCP in 24 hours to be reevaluated and to ensure your symptoms are improving Prednisone 40 mg daily for 3 days prescribed.  Take as directed and to completion. Take Benadryl 25 mg daily for the next 3 days. Pepcid 20 mg twice daily for 3 days Call 911 if you have any new or worsening symptoms such as difficulty breathing, shortness of breath, chest pain, nausea, vomiting, throat tightness or swelling, tongue swelling or tingling, worsening lip or facial swelling, abdominal pain, changes in bowel or bladder habits, no improvement despite medications, etc..Marland Kitchen

## 2018-11-10 ENCOUNTER — Other Ambulatory Visit (HOSPITAL_COMMUNITY): Payer: Self-pay | Admitting: *Deleted

## 2018-11-10 DIAGNOSIS — N6320 Unspecified lump in the left breast, unspecified quadrant: Secondary | ICD-10-CM

## 2018-12-01 ENCOUNTER — Ambulatory Visit (HOSPITAL_COMMUNITY): Payer: PRIVATE HEALTH INSURANCE

## 2018-12-01 ENCOUNTER — Inpatient Hospital Stay (HOSPITAL_COMMUNITY): Admission: RE | Admit: 2018-12-01 | Payer: Self-pay | Source: Ambulatory Visit

## 2018-12-01 ENCOUNTER — Ambulatory Visit (HOSPITAL_COMMUNITY)
Admission: RE | Admit: 2018-12-01 | Discharge: 2018-12-01 | Disposition: A | Discharge status: Home / Self Care | Payer: PRIVATE HEALTH INSURANCE | Source: Ambulatory Visit | Attending: *Deleted | Admitting: *Deleted

## 2018-12-01 ENCOUNTER — Other Ambulatory Visit: Payer: Self-pay

## 2018-12-01 ENCOUNTER — Other Ambulatory Visit (HOSPITAL_COMMUNITY): Payer: Self-pay

## 2018-12-01 DIAGNOSIS — N6320 Unspecified lump in the left breast, unspecified quadrant: Secondary | ICD-10-CM | POA: Insufficient documentation

## 2019-03-16 ENCOUNTER — Ambulatory Visit (INDEPENDENT_AMBULATORY_CARE_PROVIDER_SITE_OTHER): Payer: Self-pay | Admitting: Cardiology

## 2019-03-16 ENCOUNTER — Encounter: Payer: Self-pay | Admitting: Cardiology

## 2019-03-16 ENCOUNTER — Other Ambulatory Visit: Payer: Self-pay

## 2019-03-16 VITALS — BP 118/80 | HR 63 | Ht 62.5 in | Wt 124.0 lb

## 2019-03-16 DIAGNOSIS — I25119 Atherosclerotic heart disease of native coronary artery with unspecified angina pectoris: Secondary | ICD-10-CM

## 2019-03-16 DIAGNOSIS — I10 Essential (primary) hypertension: Secondary | ICD-10-CM

## 2019-03-16 DIAGNOSIS — E782 Mixed hyperlipidemia: Secondary | ICD-10-CM

## 2019-03-16 NOTE — Progress Notes (Signed)
Cardiology Office Note  Date: 03/16/2019   ID: SAMIKSHA PELLICANO, DOB 10/31/1955, MRN [id number]  PCP:  Sharilyn Sites, MD  Cardiologist: Satira Sark, MD Electrophysiologist:  None   Chief Complaint  Patient presents with  . Cardiac follow-up    History of Present Illness: Erika Harrison is a 64 y.o. female last seen in January 2020.  She presents for a routine visit.  She tells me that she has been doing well overall, has been walking for exercise, usually a mile and a half 6 days a week.  She has lost some weight with this.  Does not report any angina symptoms or nitroglycerin use.  She does have occasional brief palpitations but no syncope.  I personally reviewed her ECG today which shows normal sinus rhythm.  I reviewed her medications which are outlined below.  Current cardiac regimen includes aspirin, Coreg, lisinopril, Pravachol, and as needed nitroglycerin.  She plans to have lab work done through the health department.  Past Medical History:  Diagnosis Date  . Coronary atherosclerosis of native coronary artery    Subtotally occluded distal LAD  . Depression with anxiety   . Essential hypertension   . Mitral valve prolapse 1995  . Non-ST elevation MI (NSTEMI) ([place])    Ulcerated plaque mid LAD 2/12 - managed medically    Past Surgical History:  Procedure Laterality Date  . Laparoscopic abdominal surgery    . Right breast surgery      Current Outpatient Medications  Medication Sig Dispense Refill  . acetaminophen (TYLENOL) 325 MG tablet Take 325 mg by mouth every 6 (six) hours as needed.    Marland Kitchen aspirin 81 MG EC tablet Take 81 mg by mouth daily.      . carvedilol (COREG) 6.25 MG tablet Take 1 tablet (6.25 mg total) by mouth 2 (two) times daily. 180 tablet 3  . clonazePAM (KLONOPIN) 1 MG tablet Take 0.25-1 mg by mouth 3 (three) times daily as needed for anxiety.     . famotidine (PEPCID) 20 MG tablet Take 1 tablet (20 mg total) by mouth 2 (two) times  daily for 3 days. 6 tablet 0  . lisinopril (PRINIVIL,ZESTRIL) 5 MG tablet Take 1 tablet (5 mg total) by mouth daily. 90 tablet 3  . Multiple Vitamin (MULTIVITAMIN WITH MINERALS) TABS tablet Take 1 tablet by mouth daily.    . nitroGLYCERIN (NITROSTAT) 0.4 MG SL tablet Place 1 tablet (0.4 mg total) under the tongue every 5 (five) minutes as needed. 100 tablet 3  . Omega-3 Fatty Acids (FISH OIL) 1000 MG CAPS Take 1 capsule by mouth daily.      Marland Kitchen PARoxetine (PAXIL) 20 MG tablet Take 40 mg by mouth daily.    . polycarbophil (FIBERCON) 625 MG tablet Take 625 mg by mouth daily.      . pravastatin (PRAVACHOL) 40 MG tablet Take 1 tablet (40 mg total) by mouth every evening. 90 tablet 3   No current facility-administered medications for this visit.   Allergies:  Patient has no known allergies.   Social History: The patient  reports that she has never smoked. She has never used smokeless tobacco. She reports that she does not drink alcohol or use drugs.   ROS:  Please see the history of present illness. Otherwise, complete review of systems is positive for none.  All other systems are reviewed and negative.   Physical Exam: VS:  BP 118/80   Pulse 63   Ht 5' 2.5" (1.588  m)   Wt 124 lb (56.2 kg)   LMP 12/27/2010   SpO2 98%   BMI 22.32 kg/m , BMI Body mass index is 22.32 kg/m.  Wt Readings from Last 3 Encounters:  03/16/19 124 lb (56.2 kg)  03/19/18 130 lb (59 kg)  12/15/17 130 lb (59 kg)    General: Patient appears comfortable at rest. HEENT: Conjunctiva and lids normal, wearing a mask. Neck: Supple, no elevated JVP or carotid bruits, no thyromegaly. Lungs: Clear to auscultation, nonlabored breathing at rest. Cardiac: Regular rate and rhythm, no S3 or significant systolic murmur, no pericardial rub. Abdomen: Soft, nontender, bowel sounds present. Extremities: No pitting edema, distal pulses 2+. Skin: Warm and dry. Musculoskeletal: No kyphosis. Neuropsychiatric: Alert and oriented x3,  affect grossly appropriate.  ECG:  An ECG dated 12/15/2017 was personally reviewed today and demonstrated:  Sinus bradycardia.  Recent Labwork:  February 2018: Cholesterol 138, triglycerides 52, HDL 63, LDL 65  Other Studies Reviewed Today:  Echocardiogram 01/30/2018: Study Conclusions  - Left ventricle: The cavity size was normal. Wall thickness was normal. Systolic function was normal. The estimated ejection fraction was in the range of 55% to 60%. Wall motion was normal; there were no regional wall motion abnormalities. Indeterminate diastolic function. - Aortic valve: There was mild regurgitation. - Mitral valve: There was trivial regurgitation. - Right atrium: Central venous pressure (est): 3 mm Hg. - Atrial septum: No defect or patent foramen ovale was identified. - Tricuspid valve: There was mild regurgitation. - Pulmonary arteries: PA peak pressure: 21 mm Hg (S). - Pericardium, extracardiac: There was no pericardial effusion.  Event monitor 12/18/2017: Available strips from 30-day event monitor reviewed. Sinus rhythm is present throughout. There were occasional PACs and PVCs noted, one 5 beat burst of NSVT on November 11 (no syncope). Also junctional rhythm at 60 bpm noted on November 18. Heart rate ranged from 47 bpm up to 127 bpm with average heart rate 66 bpm. There were no sustained arrhythmias or pauses.  Assessment and Plan:  1.  CAD with subtotally occluded distal LAD, no active angina symptoms on medical therapy.  ECG reviewed and normal.  Continue aspirin, Coreg, lisinopril, and Pravachol.  Continue with regular walking plan.  2.  Mixed hyperlipidemia, tolerating Pravachol.  She plans to have lab work done through the health department.  3.  Essential hypertension by history, systolic is normal today.  No change in current regimen.  4.  Intermittent, brief palpitations.  Continue observation on beta-blocker.  Medication Adjustments/Labs and Tests  Ordered: Current medicines are reviewed at length with the patient today.  Concerns regarding medicines are outlined above.   Tests Ordered: Orders Placed This Encounter  Procedures  . EKG 12-Lead    Medication Changes: No orders of the defined types were placed in this encounter.   Disposition:  Follow up 1 year in the [place] office.  Signed, Jonelle Sidle, MD, [facility] 03/16/2019 4:09 PM    [facility] [address] [place], [place], [place] [address] Phone: [phone]; Fax: [phone]

## 2019-03-16 NOTE — Patient Instructions (Addendum)

## 2019-03-18 ENCOUNTER — Other Ambulatory Visit: Payer: Self-pay

## 2019-03-18 ENCOUNTER — Other Ambulatory Visit: Payer: Self-pay | Admitting: *Deleted

## 2019-03-18 MED ORDER — PRAVASTATIN SODIUM 40 MG PO TABS: 40.0000 mg | ORAL_TABLET | Freq: Every evening | ORAL | 3 refills | Status: DC

## 2019-03-18 MED ORDER — LISINOPRIL 5 MG PO TABS: 5.0000 mg | ORAL_TABLET | Freq: Every day | ORAL | 3 refills | Status: DC

## 2019-03-18 MED ORDER — CARVEDILOL 6.25 MG PO TABS: 6.2500 mg | ORAL_TABLET | Freq: Two times a day (BID) | ORAL | 3 refills | Status: DC

## 2019-03-18 MED ORDER — NITROGLYCERIN 0.4 MG SL SUBL: 0.4000 mg | SUBLINGUAL_TABLET | SUBLINGUAL | 3 refills | Status: DC | PRN

## 2019-03-18 NOTE — Telephone Encounter (Signed)
Refilled for second time to [facility], both coreg and pravachol

## 2019-12-28 IMAGING — MG MM DIGITAL DIAGNOSTIC BILAT W/ TOMO W/ CAD
6 of 10 series · 6 of 30 positions shown · non-contrast
Comparison: Previous exam(s).

CLINICAL DATA: All 63-year-old female with a palpable area of
concern in the left breast.

EXAM:
DIGITAL DIAGNOSTIC BILATERAL MAMMOGRAM WITH CAD AND TOMO

[L TAN synth-2D]
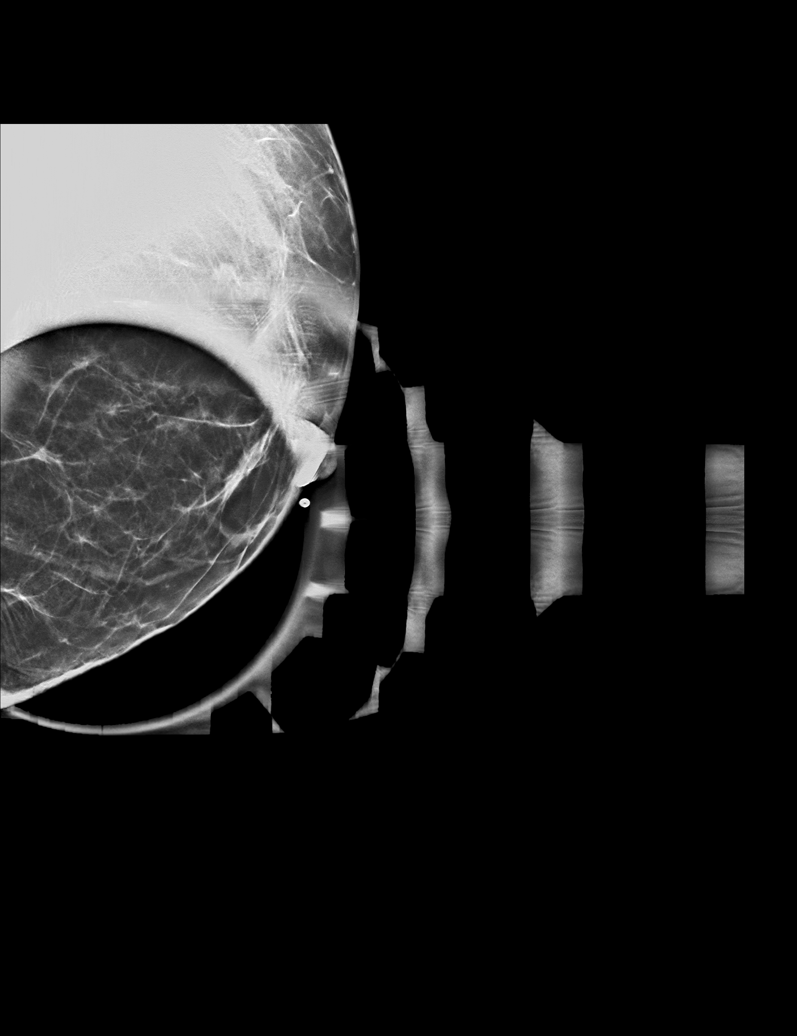

[L MLO synth-2D]
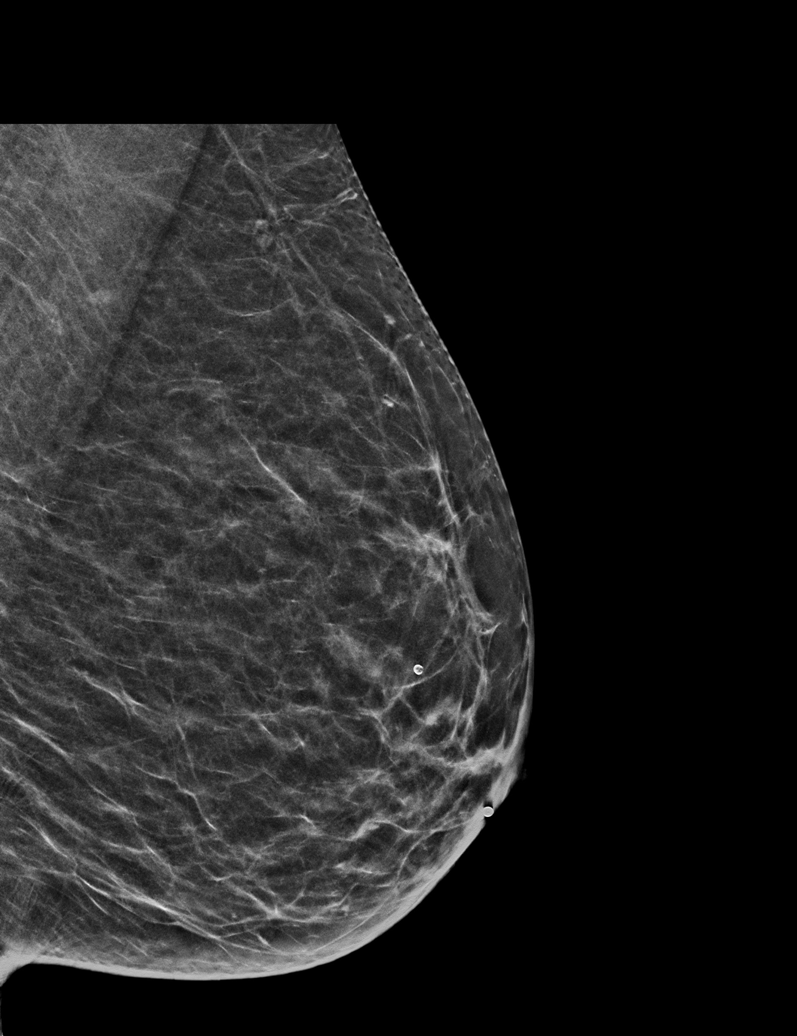

[R CC synth-2D]
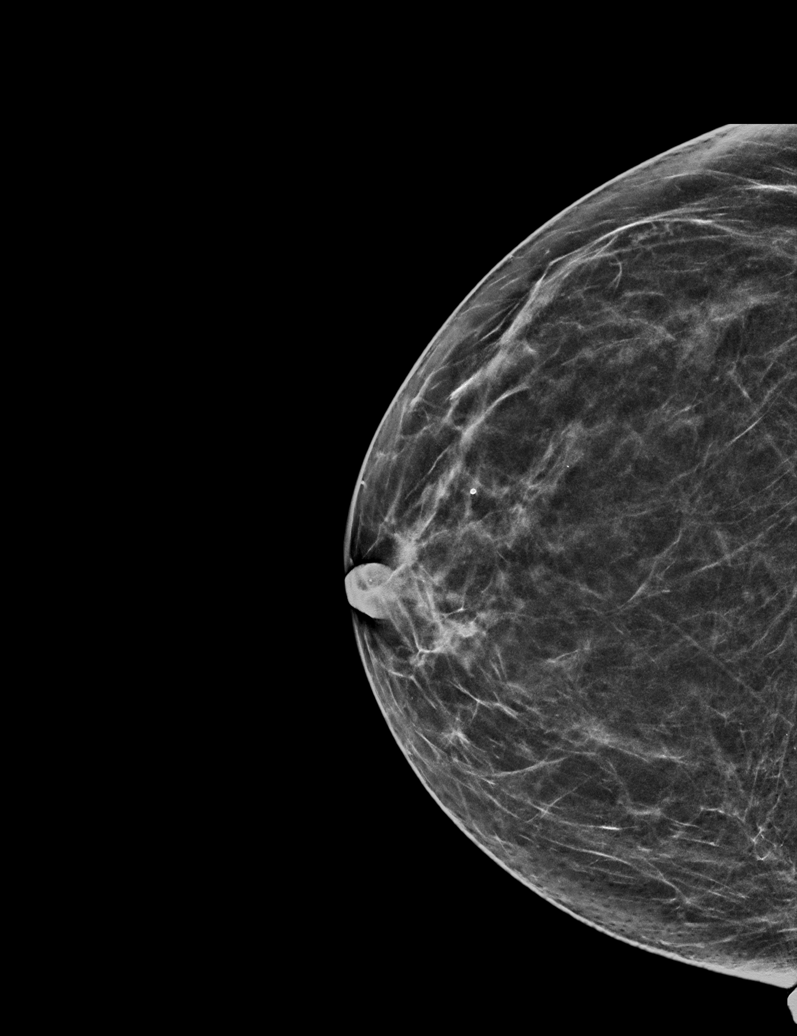

[L CC synth-2D]
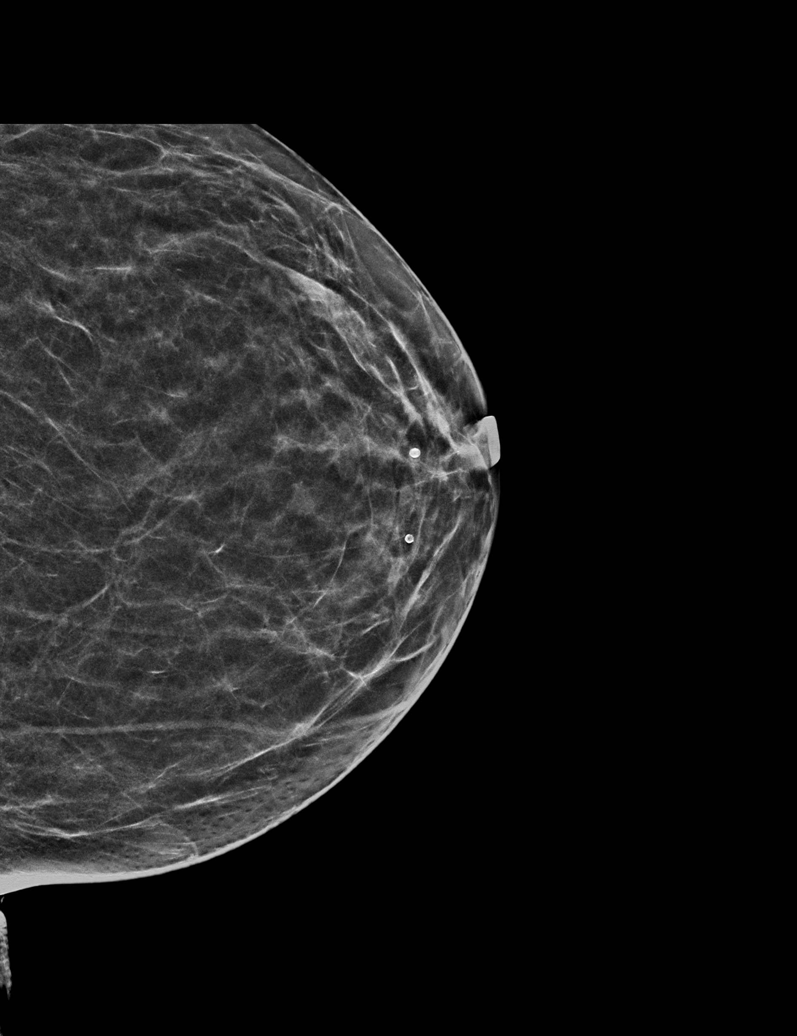

[R MLO synth-2D]
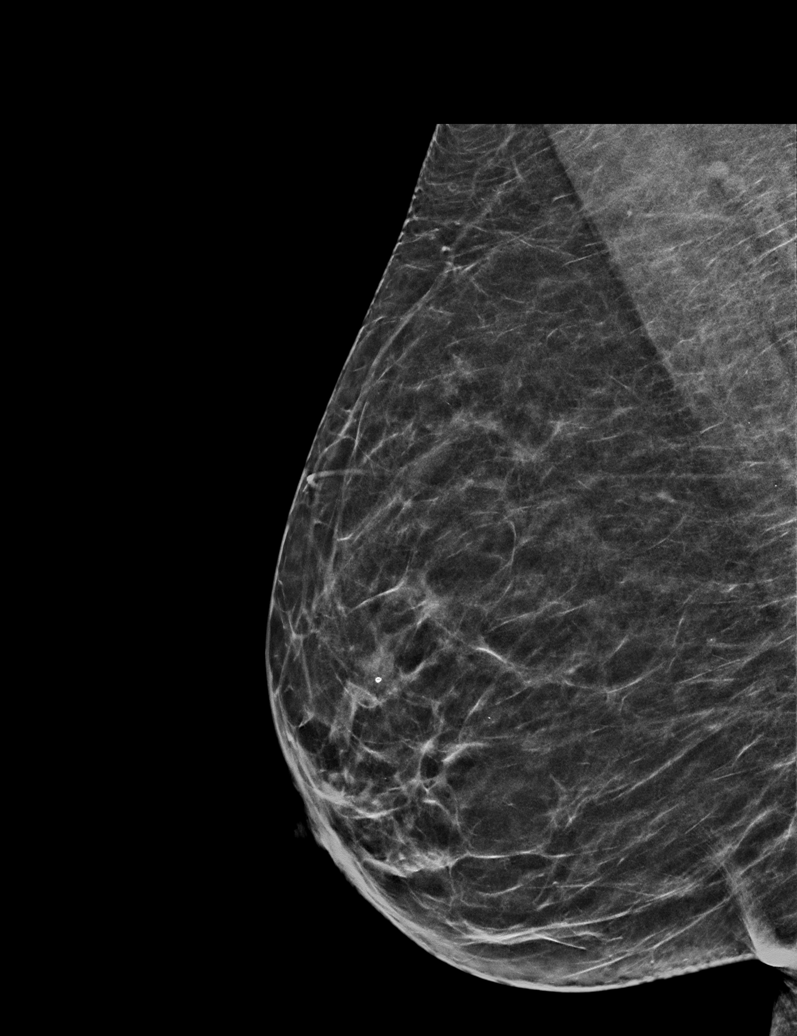

[L TAN tomo · tomo slice 19/38.0]
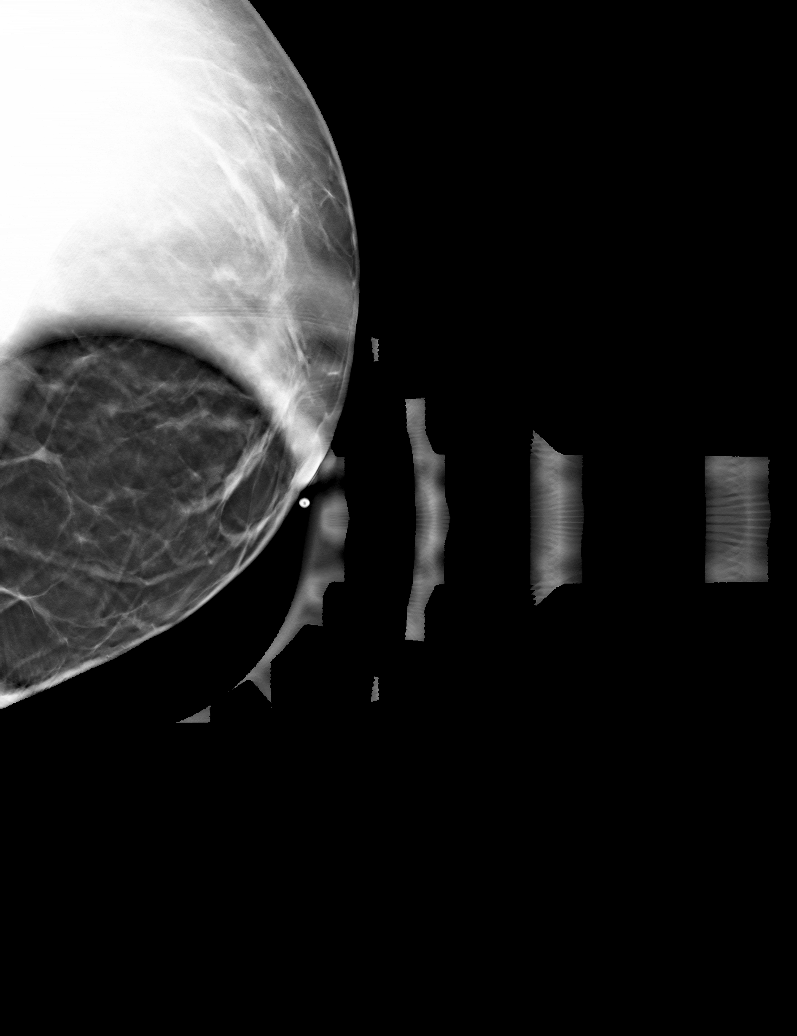

[6 of 30 positions shown; findings below may reference images not displayed]

ACR Breast Density Category b: There are scattered areas of
fibroglandular density.
FINDINGS: No suspicious masses or calcifications are seen in either breast.
Spot compression tangential tomograms were performed over the
palpable area of concern in the left breast with no definite
abnormality seen.

Mammographic images were processed with CAD.

Physical examination in the region of palpable concern does not
reveal any palpable masses.

Targeted ultrasound of the left breast was performed. No suspicious
masses or abnormality seen, only normal appearing fibroglandular
tissue identified.
IMPRESSION: 1. No mammographic or sonographic abnormalities at the site of
palpable concern in the left breast.

2.  No mammographic evidence of malignancy in either breast.

RECOMMENDATION:
1. Recommend further management of the left breast palpable area of
concern be based on clinical assessment.

2.  Screening mammogram in one year.(Code:5D-R-AR1)

I have discussed the findings and recommendations with the patient.
If applicable, a reminder letter will be sent to the patient
regarding the next appointment.

BI-RADS CATEGORY  1: Negative.

## 2020-03-06 ENCOUNTER — Telehealth: Payer: Self-pay | Admitting: Cardiology

## 2020-03-06 MED ORDER — CARVEDILOL 6.25 MG PO TABS: 6.2500 mg | ORAL_TABLET | Freq: Two times a day (BID) | ORAL | 1 refills | Status: DC

## 2020-03-06 NOTE — Telephone Encounter (Signed)
*  STAT* If patient is at the pharmacy, call can be transferred to refill team.   1. Which medications need to be refilled? (please list name of each medication and dose if known) Carvedilol  2. Which pharmacy/location (including street and city if local pharmacy) is medication to be sent to? [facility] [place]  3. Do they need a 30 day or 90 day supply? 90 day supply

## 2020-03-06 NOTE — Telephone Encounter (Signed)
Medication sent to pharmacy  

## 2020-03-24 ENCOUNTER — Other Ambulatory Visit: Payer: Self-pay | Admitting: Cardiology

## 2020-03-27 ENCOUNTER — Telehealth: Payer: Self-pay | Admitting: Cardiology

## 2020-03-27 ENCOUNTER — Other Ambulatory Visit: Payer: Self-pay

## 2020-03-27 MED ORDER — PRAVASTATIN SODIUM 40 MG PO TABS: 40.0000 mg | ORAL_TABLET | Freq: Every evening | ORAL | 3 refills | Status: DC

## 2020-03-27 NOTE — Telephone Encounter (Signed)
Medication refill request approved for Pravastatin 40 mg tablet and sent to pharmacy.

## 2020-03-27 NOTE — Telephone Encounter (Signed)
Needing refill on pravastatin (PRAVACHOL) 40 MG tablet [[id number]]  Sent to [facility]

## 2020-04-06 NOTE — Progress Notes (Signed)
Cardiology Office Note  Date: 04/07/2020   ID: Erika Harrison, DOB 12-Oct-1955, MRN [id number]  PCP:  Assunta Found, MD  Cardiologist:  Nona Dell, MD Electrophysiologist:  None   Chief Complaint  Patient presents with  . Cardiac follow-up    History of Present Illness: Erika Harrison is a 65 y.o. female last seen in January 2021.  She presents for a routine visit.  Reports no angina symptoms or nitroglycerin use in the interim.  She has been walking with her family for exercise, either outdoors, or goes to walk in the [employer].  I reviewed her medications which are stable from a cardiac perspective and outlined below.  ECG today shows normal sinus rhythm.  She is due for follow-up lab work with Dr. Phillips Odor.  Her last LDL was 65 in February 2021.  Past Medical History:  Diagnosis Date  . Coronary atherosclerosis of native coronary artery    Subtotally occluded distal LAD  . Depression with anxiety   . Essential hypertension   . Mitral valve prolapse 1995  . Non-ST elevation MI (NSTEMI) (HCC)    Ulcerated plaque mid LAD 2/12 - managed medically    Past Surgical History:  Procedure Laterality Date  . Laparoscopic abdominal surgery    . Right breast surgery      Current Outpatient Medications  Medication Sig Dispense Refill  . acetaminophen (TYLENOL) 325 MG tablet Take 325 mg by mouth every 6 (six) hours as needed.    Marland Kitchen aspirin 81 MG EC tablet Take 81 mg by mouth daily.    . carvedilol (COREG) 6.25 MG tablet Take 1 tablet (6.25 mg total) by mouth 2 (two) times daily. 180 tablet 1  . clonazePAM (KLONOPIN) 1 MG tablet Take 0.25-1 mg by mouth 3 (three) times daily as needed for anxiety.    Marland Kitchen lisinopril (ZESTRIL) 5 MG tablet Take 1 tablet (5 mg total) by mouth daily. 90 tablet 3  . Multiple Vitamin (MULTIVITAMIN WITH MINERALS) TABS tablet Take 1 tablet by mouth daily.    . nitroGLYCERIN (NITROSTAT) 0.4 MG SL tablet Place 1 tablet (0.4 mg total) under the  tongue every 5 (five) minutes x 3 doses as needed for chest pain (if no relief after 3rd dose, proceed to the ED for an evaluation or call 911). 75 tablet 3  . Omega-3 Fatty Acids (FISH OIL) 1000 MG CAPS Take 1 capsule by mouth daily.    Marland Kitchen PARoxetine (PAXIL) 20 MG tablet Take 40 mg by mouth daily.    . polycarbophil (FIBERCON) 625 MG tablet Take 625 mg by mouth daily.    . pravastatin (PRAVACHOL) 40 MG tablet Take 1 tablet (40 mg total) by mouth every evening. 90 tablet 3  . famotidine (PEPCID) 20 MG tablet Take 1 tablet (20 mg total) by mouth 2 (two) times daily for 3 days. 6 tablet 0   No current facility-administered medications for this visit.   Allergies:  Patient has no known allergies.   ROS: No palpitations or syncope.  Physical Exam: VS:  BP 104/60   Pulse (!) 58   Ht 5\' 5"  (1.651 m)   Wt 128 lb (58.1 kg)   LMP 12/27/2010   SpO2 100%   BMI 21.30 kg/m , BMI Body mass index is 21.3 kg/m.  Wt Readings from Last 3 Encounters:  04/07/20 128 lb (58.1 kg)  03/16/19 124 lb (56.2 kg)  03/19/18 130 lb (59 kg)    General: Patient appears comfortable at rest.  HEENT: Conjunctiva and lids normal, wearing a mask. Neck: Supple, no elevated JVP or carotid bruits, no thyromegaly. Lungs: Clear to auscultation, nonlabored breathing at rest. Cardiac: Regular rate and rhythm, no S3 or significant systolic murmur, no pericardial rub. Extremities: No pitting edema.  ECG:  An ECG dated 03/16/2019 was personally reviewed today and demonstrated:  Normal sinus rhythm.  Recent Labwork:  February 2021: BUN 14, creatinine 0.73, potassium 4.1, AST 25, ALT 19, cholesterol 144, TG 53, HDL 68, LDL 65  Other Studies Reviewed Today:  Echocardiogram 01/30/2018: Study Conclusions  - Left ventricle: The cavity size was normal. Wall thickness was normal. Systolic function was normal. The estimated ejection fraction was in the range of 55% to 60%. Wall motion was normal; there were no regional  wall motion abnormalities. Indeterminate diastolic function. - Aortic valve: There was mild regurgitation. - Mitral valve: There was trivial regurgitation. - Right atrium: Central venous pressure (est): 3 mm Hg. - Atrial septum: No defect or patent foramen ovale was identified. - Tricuspid valve: There was mild regurgitation. - Pulmonary arteries: PA peak pressure: 21 mm Hg (S). - Pericardium, extracardiac: There was no pericardial effusion.  Event monitor 12/18/2017: Available strips from 30-day event monitor reviewed. Sinus rhythm is present throughout. There were occasional PACs and PVCs noted, one 5 beat burst of NSVT on November 11 (no syncope). Also junctional rhythm at 60 bpm noted on November 18. Heart rate ranged from 47 bpm up to 127 bpm with average heart rate 66 bpm. There were no sustained arrhythmias or pauses.  Assessment and Plan:  1.  Symptomatically stable CAD with subtotally occluded distal LAD.  ECG is normal today.  She is doing well with walking plan for exercise. Continue aspirin, Coreg, lisinopril, Pravachol, and as needed nitroglycerin.  2.  Mixed hyperlipidemia, continues on Pravachol with last LDL 65 and HDL 68.  3.  Essential hypertension, blood pressure is normal today.  She continues on lisinopril and Coreg.  Medication Adjustments/Labs and Tests Ordered: Current medicines are reviewed at length with the patient today.  Concerns regarding medicines are outlined above.   Tests Ordered: Orders Placed This Encounter  Procedures  . EKG 12-Lead    Medication Changes: No orders of the defined types were placed in this encounter.   Disposition:  Follow up 1 year.  Signed, Jonelle Sidle, MD, [facility] 04/07/2020 9:06 AM    [facility] at [facility] [address] [place], [place], [place] [address] Phone: [phone]; Fax: [phone]

## 2020-04-07 ENCOUNTER — Other Ambulatory Visit: Payer: Self-pay

## 2020-04-07 ENCOUNTER — Encounter: Payer: Self-pay | Admitting: Cardiology

## 2020-04-07 ENCOUNTER — Ambulatory Visit (INDEPENDENT_AMBULATORY_CARE_PROVIDER_SITE_OTHER): Payer: Self-pay | Admitting: Cardiology

## 2020-04-07 ENCOUNTER — Telehealth: Payer: Self-pay | Admitting: Cardiology

## 2020-04-07 VITALS — BP 104/60 | HR 58 | Ht 65.0 in | Wt 128.0 lb

## 2020-04-07 DIAGNOSIS — E782 Mixed hyperlipidemia: Secondary | ICD-10-CM

## 2020-04-07 DIAGNOSIS — I25119 Atherosclerotic heart disease of native coronary artery with unspecified angina pectoris: Secondary | ICD-10-CM

## 2020-04-07 DIAGNOSIS — I10 Essential (primary) hypertension: Secondary | ICD-10-CM

## 2020-04-07 NOTE — Progress Notes (Signed)
Orders for FLP per Dr. Diona Browner.

## 2020-04-07 NOTE — Telephone Encounter (Signed)
Order FLP.

## 2020-04-07 NOTE — Telephone Encounter (Signed)
FLP order per Dr. Diona Browner.

## 2020-04-07 NOTE — Patient Instructions (Signed)
  Medication Instructions:  Your physician recommends that you continue on your current medications as directed. Please refer to the Current Medication list given to you today.  *If you need a refill on your cardiac medications before your next appointment, please call your pharmacy*   Lab Work: None today If you have labs (blood work) drawn today and your tests are completely normal, you will receive your results only by: Marland Kitchen MyChart Message (if you have MyChart) OR . A paper copy in the mail If you have any lab test that is abnormal or we need to change your treatment, we will call you to review the results.   Testing/Procedures: None today   Follow-Up: At [facility], you and your health needs are our priority.  As part of our continuing mission to provide you with exceptional heart care, we have created designated Provider Care Teams.  These Care Teams include your primary Cardiologist (physician) and Advanced Practice Providers (APPs -  Physician Assistants and Nurse Practitioners) who all work together to provide you with the care you need, when you need it.  We recommend signing up for the patient portal called "MyChart".  Sign up information is provided on this After Visit Summary.  MyChart is used to connect with patients for Virtual Visits (Telemedicine).  Patients are able to view lab/test results, encounter notes, upcoming appointments, etc.  Non-urgent messages can be sent to your provider as well.   To learn more about what you can do with MyChart, go to [email or web address].    Your next appointment:    1 year  The format for your next appointment:   In Person  Provider:   Nona Dell, MD   Other Instructions None    Thank you for choosing [facility] !

## 2020-04-07 NOTE — Telephone Encounter (Signed)
Patient called to let Dr. Diona Browner know that her appointment for lab work with Dr. Phillips Odor is not until August 2022. She would like Dr. Diona Browner to order the labs for her cholesterol check. She is concerned about plaque in her eye. She said the last time she had labs was February 2021.

## 2020-04-10 ENCOUNTER — Telehealth: Payer: Self-pay | Admitting: Cardiology

## 2020-04-10 ENCOUNTER — Other Ambulatory Visit: Payer: Self-pay

## 2020-04-10 ENCOUNTER — Other Ambulatory Visit (HOSPITAL_COMMUNITY)
Admission: RE | Admit: 2020-04-10 | Discharge: 2020-04-10 | Disposition: A | Discharge status: Home / Self Care | Payer: Self-pay | Source: Ambulatory Visit | Attending: Cardiology | Admitting: Cardiology

## 2020-04-10 DIAGNOSIS — E782 Mixed hyperlipidemia: Secondary | ICD-10-CM | POA: Insufficient documentation

## 2020-04-10 LAB — LIPID PANEL
Cholesterol: 146 mg/dL (ref 0–200)
HDL: 72 mg/dL (ref >40)
LDL Cholesterol: 66 mg/dL (ref 0–99)
Total CHOL/HDL Ratio: 2 RATIO
Triglycerides: 41 mg/dL (ref <150)
VLDL: 8 mg/dL (ref 0–40)

## 2020-04-10 MED ORDER — LISINOPRIL 5 MG PO TABS: 5.0000 mg | ORAL_TABLET | Freq: Every day | ORAL | 3 refills | Status: DC

## 2020-04-10 NOTE — Telephone Encounter (Signed)
New message     *STAT* If patient is at the pharmacy, call can be transferred to refill team.   1. Which medications need to be refilled? (please list name of each medication and dose if known)  lisinopril lisinopril (ZESTRIL) 5 MG tablet   2. Which pharmacy/location (including street and city if local pharmacy) is medication to be sent to? [facility] in [place]   3. Do they need a 30 day or 90 day supply? 90

## 2020-04-10 NOTE — Telephone Encounter (Signed)
Medication refill request approved and sent to pharmacy.  

## 2020-04-13 ENCOUNTER — Telehealth: Payer: Self-pay | Admitting: *Deleted

## 2020-04-13 NOTE — Telephone Encounter (Signed)
-----   Message from Jonelle Sidle, MD sent at 04/10/2020 11:38 AM EST ----- Results reviewed.  Please let her know that lipids still look to be in good order, LDL 66 and HDL 72.  Continue with current medications and follow-up plan.

## 2020-04-13 NOTE — Telephone Encounter (Signed)
Patient informed. Copy sent to PCP °

## 2020-08-15 ENCOUNTER — Other Ambulatory Visit: Payer: Self-pay | Admitting: Cardiology

## 2020-08-16 ENCOUNTER — Telehealth: Payer: Self-pay

## 2020-08-16 MED ORDER — CARVEDILOL 6.25 MG PO TABS: 6.2500 mg | ORAL_TABLET | Freq: Two times a day (BID) | ORAL | 3 refills | Status: DC

## 2020-08-16 NOTE — Telephone Encounter (Signed)
-----   Message from Belva Bertin sent at 08/16/2020 11:19 AM EDT ----- Regarding: refill Patient called stating she needs a refill on her carvediolol. When she was seen at her last office visit she was not given any refills.

## 2020-08-16 NOTE — Telephone Encounter (Signed)
Medication refill request approved for Carvedilol 6.25 mg tablets and sent to [facility] per pt request.

## 2020-10-16 ENCOUNTER — Telehealth: Payer: Self-pay | Admitting: Cardiology

## 2020-10-16 MED ORDER — PRAVASTATIN SODIUM 40 MG PO TABS: 40.0000 mg | ORAL_TABLET | Freq: Every evening | ORAL | 3 refills | Status: DC

## 2020-10-16 NOTE — Telephone Encounter (Signed)
Approved.  

## 2020-10-16 NOTE — Telephone Encounter (Signed)
*  STAT* If patient is at the pharmacy, call can be transferred to refill team.   1. Which medications need to be refilled? (please list name of each medication and dose if known) pravastatin (PRAVACHOL) 40 MG tablet [[id number]]   2. Which pharmacy/location (including street and city if local pharmacy) is medication to be sent to? [facility]   3. Do they need a 30 day or 90 day supply?

## 2020-12-30 ENCOUNTER — Encounter: Payer: Self-pay | Admitting: Emergency Medicine

## 2020-12-30 ENCOUNTER — Other Ambulatory Visit: Payer: Self-pay

## 2020-12-30 ENCOUNTER — Ambulatory Visit
Admission: EM | Admit: 2020-12-30 | Discharge: 2020-12-30 | Disposition: A | Discharge status: Home / Self Care | Payer: Self-pay | Attending: Family Medicine | Admitting: Family Medicine

## 2020-12-30 DIAGNOSIS — N3090 Cystitis, unspecified without hematuria: Secondary | ICD-10-CM

## 2020-12-30 LAB — POCT URINALYSIS DIP (MANUAL ENTRY)
Bilirubin, UA: NEGATIVE
Blood, UA: NEGATIVE
Glucose, UA: NEGATIVE mg/dL
Ketones, POC UA: NEGATIVE mg/dL
Leukocytes, UA: NEGATIVE
Nitrite, UA: POSITIVE — AB
Protein Ur, POC: NEGATIVE mg/dL
Spec Grav, UA: <=1.005 — AB (ref 1.010–1.025)
Urobilinogen, UA: 0.2 E.U./dL
pH, UA: 5.5 (ref 5.0–8.0)

## 2020-12-30 MED ORDER — CEPHALEXIN 500 MG PO CAPS: 500.0000 mg | ORAL_CAPSULE | Freq: Two times a day (BID) | ORAL | 0 refills | Status: DC

## 2020-12-30 NOTE — ED Provider Notes (Signed)
[facility]    ASSESSMENT & PLAN:  1. Cystitis    Begin: Meds ordered this encounter  Medications   cephALEXin (KEFLEX) 500 MG capsule    Sig: Take 1 capsule (500 mg total) by mouth 2 (two) times daily.    Dispense:  10 capsule    Refill:  0   No signs of pyelonephritis. Discussed. Urine culture sent. Will follow up with her PCP or here if not showing improvement over the next 48 hours, sooner if needed.  Outlined signs and symptoms indicating need for more acute intervention. Patient verbalized understanding. After Visit Summary given.  SUBJECTIVE:  Erika Harrison is a 65 y.o. female who complains of suprapubic cramping with mild lower back pain; grad onset; few days. Without associated fever, chills, vaginal discharge or bleeding. Gross hematuria: not present. No specific aggravating or alleviating factors reported. No LE edema. Normal PO intake without n/v/d. Without specific abdominal pain. Ambulatory without difficulty. OTC treatment: none reported. H/O UTI: distant past.  LMP: Patient's last menstrual period was 12/27/2010.  OBJECTIVE:  Vitals:   12/30/20 0841 12/30/20 0842  BP: 117/66   Pulse: 61   Resp: 18   Temp: 98.9 F (37.2 C)   SpO2: 96%   Weight:  58.1 kg   General appearance: alert; no distress ungs: unlabored respirations Abdomen: benign Back: no CVA tenderness Extremities: no edema; symmetrical with no gross deformities Skin: warm and dry Neurologic: normal gait Psychological: alert and cooperative; normal mood and affect  Labs Reviewed  POCT URINALYSIS DIP (MANUAL ENTRY) - Abnormal; Notable for the following components:      Result Value   Color, UA orange (*)    Clarity, UA cloudy (*)    Spec Grav, UA <=1.005 (*)    Nitrite, UA Positive (*)    All other components within normal limits  URINE CULTURE    No Known Allergies  Past Medical History:  Diagnosis Date   Coronary atherosclerosis of native coronary artery     Subtotally occluded distal LAD   Depression with anxiety    Essential hypertension    Mitral valve prolapse 1995   Non-ST elevation MI (NSTEMI) (HCC)    Ulcerated plaque mid LAD 2/12 - managed medically   Social History   Socioeconomic History   Marital status: Married    Spouse name: Not on file   Number of children: Not on file   Years of education: Not on file   Highest education level: Not on file  Occupational History   Not on file  Tobacco Use   Smoking status: Never   Smokeless tobacco: Never  Vaping Use   Vaping Use: Never used  Substance and Sexual Activity   Alcohol use: No   Drug use: No   Sexual activity: Not on file  Other Topics Concern   Not on file  Social History Narrative               Social Determinants of Health   Financial Resource Strain: Not on file  Food Insecurity: Not on file  Transportation Needs: Not on file  Physical Activity: Not on file  Stress: Not on file  Social Connections: Not on file  Intimate Partner Violence: Not on file   Family History  Problem Relation Age of Onset   Cancer Father        Renal cell carcinoma   Rheum arthritis Brother    Hypertension Sister    Drug abuse Brother  Vanessa Kick, MD 12/30/20 [phone]

## 2020-12-30 NOTE — ED Triage Notes (Signed)
Pt has taken AZO.

## 2020-12-30 NOTE — ED Triage Notes (Signed)
For a few days has been having lower abd pain cramping and lower back pain.

## 2020-12-31 LAB — URINE CULTURE: Culture: NO GROWTH

## 2021-03-27 ENCOUNTER — Telehealth: Payer: Self-pay | Admitting: Cardiology

## 2021-03-27 MED ORDER — LISINOPRIL 5 MG PO TABS: 5.0000 mg | ORAL_TABLET | Freq: Every day | ORAL | 1 refills | Status: DC

## 2021-03-27 NOTE — Telephone Encounter (Signed)
°*  STAT* If patient is at the pharmacy, call can be transferred to refill team.   1. Which medications need to be refilled? (please list name of each medication and dose if known) lisinopril (ZESTRIL) 5 MG tablet  2. Which pharmacy/location (including street and city if local pharmacy) is medication to be sent to? [place], [place] - [id number]  3. Do they need a 30 day or 90 day supply?  Needs enough medication to last her until her upcoming appt on 04/21.

## 2021-06-15 ENCOUNTER — Ambulatory Visit (INDEPENDENT_AMBULATORY_CARE_PROVIDER_SITE_OTHER): Payer: Self-pay | Admitting: Cardiology

## 2021-06-15 ENCOUNTER — Encounter: Payer: Self-pay | Admitting: Cardiology

## 2021-06-15 VITALS — BP 110/70 | HR 66 | Ht 65.0 in | Wt 124.4 lb

## 2021-06-15 DIAGNOSIS — I25119 Atherosclerotic heart disease of native coronary artery with unspecified angina pectoris: Secondary | ICD-10-CM

## 2021-06-15 DIAGNOSIS — E782 Mixed hyperlipidemia: Secondary | ICD-10-CM

## 2021-06-15 DIAGNOSIS — I10 Essential (primary) hypertension: Secondary | ICD-10-CM

## 2021-06-15 NOTE — Patient Instructions (Signed)

## 2021-06-15 NOTE — Progress Notes (Signed)
, ? ? ?Cardiology Office Note ? ?Date: 06/15/2021  ? ?ID: Erika Harrison, DOB January 30, 1956, MRN [id number] ? ?PCP:  Assunta Found, MD  ?Cardiologist:  Nona Dell, MD ?Electrophysiologist:  None  ? ?Chief Complaint  ?Patient presents with  ? Cardiac follow-up  ? ? ?History of Present Illness: ?Erika Harrison is a 66 y.o. female last seen in February 2022.  She is here for a routine visit.  Doing well from a cardiac perspective, she walks 30 minutes 6 days a week (pushes her dog in a stroller).  She does not describe any angina symptoms, stable NYHA class I dyspnea.  No palpitations or syncope. ? ?I reviewed her medications which are noted below.  She states that she has been compliant with therapy, did have follow-up with PCP earlier this year.  I personally reviewed her ECG today which shows sinus bradycardia. ? ?Past Medical History:  ?Diagnosis Date  ? Coronary atherosclerosis of native coronary artery   ? Subtotally occluded distal LAD  ? Depression with anxiety   ? Essential hypertension   ? Mitral valve prolapse 1995  ? Non-ST elevation MI (NSTEMI) (HCC)   ? Ulcerated plaque mid LAD 2/12 - managed medically  ? ? ?Past Surgical History:  ?Procedure Laterality Date  ? Laparoscopic abdominal surgery    ? Right breast surgery    ? ? ?Current Outpatient Medications  ?Medication Sig Dispense Refill  ? acetaminophen (TYLENOL) 325 MG tablet Take 325 mg by mouth every 6 (six) hours as needed.    ? aspirin 81 MG EC tablet Take 81 mg by mouth daily.    ? carvedilol (COREG) 6.25 MG tablet Take 1 tablet (6.25 mg total) by mouth 2 (two) times daily. 180 tablet 3  ? clonazePAM (KLONOPIN) 1 MG tablet Take 0.25-1 mg by mouth 3 (three) times daily as needed for anxiety.    ? famotidine (PEPCID) 20 MG tablet Take 1 tablet (20 mg total) by mouth 2 (two) times daily for 3 days. 6 tablet 0  ? lisinopril (ZESTRIL) 5 MG tablet Take 1 tablet (5 mg total) by mouth daily. 90 tablet 1  ? Multiple Vitamin (MULTIVITAMIN WITH  MINERALS) TABS tablet Take 1 tablet by mouth daily.    ? nitroGLYCERIN (NITROSTAT) 0.4 MG SL tablet Place 1 tablet (0.4 mg total) under the tongue every 5 (five) minutes x 3 doses as needed for chest pain (if no relief after 3rd dose, proceed to the ED for an evaluation or call 911). 75 tablet 3  ? Omega-3 Fatty Acids (FISH OIL) 1000 MG CAPS Take 1 capsule by mouth daily.    ? polycarbophil (FIBERCON) 625 MG tablet Take 625 mg by mouth daily.    ? pravastatin (PRAVACHOL) 40 MG tablet Take 1 tablet (40 mg total) by mouth every evening. 90 tablet 3  ? PARoxetine (PAXIL) 40 MG tablet Take 40 mg by mouth daily. (Patient not taking: Reported on 06/15/2021)    ? ?No current facility-administered medications for this visit.  ? ?Allergies:  Patient has no known allergies.  ? ?ROS: No syncope. ? ?Physical Exam: ?VS:  BP 110/70   Pulse 66   Ht 5\' 5"  (1.651 m)   Wt 124 lb 6.4 oz (56.4 kg)   LMP 12/27/2010   SpO2 98%   BMI 20.70 kg/m? , BMI Body mass index is 20.7 kg/m?. ? ?Wt Readings from Last 3 Encounters:  ?06/15/21 124 lb 6.4 oz (56.4 kg)  ?12/30/20 128 lb (58.1 kg)  ?04/07/20  128 lb (58.1 kg)  ?  ?General: Patient appears comfortable at rest. ?HEENT: Conjunctiva and lids normal. ?Neck: Supple, no elevated JVP or carotid bruits, no thyromegaly. ?Lungs: Clear to auscultation, nonlabored breathing at rest. ?Cardiac: Regular rate and rhythm, no S3 or significant systolic murmur. ?Extremities: No pitting edema. ? ?ECG:  An ECG dated 04/07/2020 was personally reviewed today and demonstrated:  Sinus rhythm. ? ?Recent Labwork: ?   ?Component Value Date/Time  ? CHOL 146 04/10/2020 1022  ? CHOL 138 04/02/2016 1035  ? TRIG 41 04/10/2020 1022  ? HDL 72 04/10/2020 1022  ? HDL 63 04/02/2016 1035  ? CHOLHDL 2.0 04/10/2020 1022  ? VLDL 8 04/10/2020 1022  ? LDLCALC 66 04/10/2020 1022  ? LDLCALC 65 04/02/2016 1035  ? ? ?Other Studies Reviewed Today: ? ?Echocardiogram 01/30/2018: ?Study Conclusions ?  ?- Left ventricle: The cavity size  was normal. Wall thickness was ?  normal. Systolic function was normal. The estimated ejection ?  fraction was in the range of 55% to 60%. Wall motion was normal; ?  there were no regional wall motion abnormalities. Indeterminate ?  diastolic function. ?- Aortic valve: There was mild regurgitation. ?- Mitral valve: There was trivial regurgitation. ?- Right atrium: Central venous pressure (est): 3 mm Hg. ?- Atrial septum: No defect or patent foramen ovale was identified. ?- Tricuspid valve: There was mild regurgitation. ?- Pulmonary arteries: PA peak pressure: 21 mm Hg (S). ?- Pericardium, extracardiac: There was no pericardial effusion. ?  ?Event monitor 12/18/2017: ?Available strips from 30-day event monitor reviewed.  Sinus rhythm is present throughout.  There were occasional PACs and PVCs noted, one 5 beat burst of NSVT on November 11 (no syncope).  Also junctional rhythm at 60 bpm noted on November 18.  Heart rate ranged from 47 bpm up to 127 bpm with average heart rate 66 bpm.  There were no sustained arrhythmias or pauses. ? ?Assessment and Plan: ? ?1.  CAD with subtotally occluded distal LAD being managed medically.  She continues to do very well without active angina and good exercise tolerance on medical therapy.  ECG reviewed.  Continue aspirin, Coreg, lisinopril, and Pravachol.  She has as needed nitroglycerin available. ? ?2.  Mixed hyperlipidemia on Pravachol.  She continues to follow with Dr. Phillips Odor.  Last LDL was 66. ? ?3.  Essential hypertension, blood pressure is normal today.  No change in current regimen. ? ?Medication Adjustments/Labs and Tests Ordered: ?Current medicines are reviewed at length with the patient today.  Concerns regarding medicines are outlined above.  ? ?Tests Ordered: ?Orders Placed This Encounter  ?Procedures  ? EKG 12-Lead  ? ? ?Medication Changes: ?No orders of the defined types were placed in this encounter. ? ? ?Disposition:  Follow up  1 year. ? ?Signed, ?Jonelle Sidle, MD, [facility] ?06/15/2021 2:32 PM    ?[facility] at [facility] ?[address] [place], [place], [place] [address] ?Phone: [phone]; Fax: [phone]  ?

## 2021-08-24 ENCOUNTER — Telehealth: Payer: Self-pay | Admitting: Cardiology

## 2021-08-24 MED ORDER — NITROGLYCERIN 0.4 MG SL SUBL: 0.4000 mg | SUBLINGUAL_TABLET | SUBLINGUAL | 3 refills | Status: DC | PRN

## 2021-08-24 NOTE — Telephone Encounter (Signed)
*  STAT* If patient is at the pharmacy, call can be transferred to refill team.   1. Which medications need to be refilled? (please list name of each medication and dose if known)  nitroGLYCERIN (NITROSTAT) 0.4 MG SL tablet  2. Which pharmacy/location (including street and city if local pharmacy) is medication to be sent to? [id number] - [place], [place] - [address]  3. Do they need a 30 day or 90 day supply? 30 with refills  Patient said her current rx is expired

## 2021-08-24 NOTE — Telephone Encounter (Signed)
Done

## 2021-08-27 ENCOUNTER — Other Ambulatory Visit: Payer: Self-pay | Admitting: Cardiology

## 2021-08-27 ENCOUNTER — Other Ambulatory Visit: Payer: Self-pay | Admitting: *Deleted

## 2021-08-27 MED ORDER — NITROGLYCERIN 0.4 MG SL SUBL: 0.4000 mg | SUBLINGUAL_TABLET | SUBLINGUAL | 3 refills | Status: AC | PRN

## 2022-02-26 ENCOUNTER — Telehealth: Payer: Self-pay | Admitting: Cardiology

## 2022-02-26 MED ORDER — CARVEDILOL 6.25 MG PO TABS: 6.2500 mg | ORAL_TABLET | Freq: Two times a day (BID) | ORAL | 1 refills | Status: DC

## 2022-02-26 NOTE — Telephone Encounter (Signed)
Completed.

## 2022-02-26 NOTE — Telephone Encounter (Signed)
*  STAT* If patient is at the pharmacy, call can be transferred to refill team.   1. Which medications need to be refilled? (please list name of each medication and dose if known)   carvedilol (COREG) 6.25 MG tablet   2. Which pharmacy/location (including street and city if local pharmacy) is medication to be sent to?  [place], [place] [address]   3. Do they need a 30 day or 90 day supply?   90 day  Patient stated she still has some of this medication.

## 2022-02-28 ENCOUNTER — Telehealth: Payer: Self-pay | Admitting: Cardiology

## 2022-02-28 MED ORDER — LISINOPRIL 5 MG PO TABS: 5.0000 mg | ORAL_TABLET | Freq: Every day | ORAL | 1 refills | Status: DC

## 2022-02-28 MED ORDER — PRAVASTATIN SODIUM 40 MG PO TABS: 40.0000 mg | ORAL_TABLET | Freq: Every evening | ORAL | 1 refills | Status: DC

## 2022-02-28 NOTE — Telephone Encounter (Signed)
Pt came in office and stated that she is out of refills for her lisinopril 5 mg and pravastatin 40 mg. Her pharmacy is [facility]

## 2022-02-28 NOTE — Telephone Encounter (Signed)
Completed.

## 2022-06-19 ENCOUNTER — Ambulatory Visit: Payer: Self-pay | Attending: Cardiology | Admitting: Cardiology

## 2022-06-19 ENCOUNTER — Encounter: Payer: Self-pay | Admitting: Cardiology

## 2022-06-19 VITALS — BP 116/76 | HR 65 | Ht 63.0 in | Wt 129.4 lb

## 2022-06-19 DIAGNOSIS — I10 Essential (primary) hypertension: Secondary | ICD-10-CM

## 2022-06-19 DIAGNOSIS — I25119 Atherosclerotic heart disease of native coronary artery with unspecified angina pectoris: Secondary | ICD-10-CM

## 2022-06-19 DIAGNOSIS — E782 Mixed hyperlipidemia: Secondary | ICD-10-CM

## 2022-06-19 DIAGNOSIS — R600 Localized edema: Secondary | ICD-10-CM

## 2022-06-19 NOTE — Progress Notes (Signed)
    Cardiology Office Note  Date: 06/19/2022   ID: ALYRICA THUROW, DOB [birth year], MRN [id number]  History of Present Illness: Erika Harrison is a 67 y.o. female last seen in April 2023.  She is here for a routine visit.  Reports no angina or nitroglycerin use, stable NYHA class I dyspnea.  She does describe a sense of tightness in her lower legs, swelling in her ankles by the end of the day.  This is better in the morning when she gets out of bed.  Has had about a 5 pound weight gain over the last year.  No change in diet, no increased sodium intake.  We went over her medications.  Blood pressure well-controlled today.  ECG also shows normal sinus rhythm with low voltage.  I reviewed her last lab work from PCP.  Physical Exam: VS:  BP 116/76   Pulse 65   Ht  (1.6 m)   Wt 129 lb 6.4 oz (58.7 kg)   LMP 12/27/2010   SpO2 98%   BMI 22.92 kg/m , BMI Body mass index is 22.92 kg/m.  Wt Readings from Last 3 Encounters:  06/19/22 129 lb 6.4 oz (58.7 kg)  06/15/21 124 lb 6.4 oz (56.4 kg)  12/30/20 128 lb (58.1 kg)    General: Patient appears comfortable at rest. HEENT: Conjunctiva and lids normal. Neck: Supple, no elevated JVP or carotid bruits. Lungs: Clear to auscultation, nonlabored breathing at rest. Cardiac: Regular rate and rhythm, no S3 or significant systolic murmur. Extremities: No pitting edema.  ECG:  An ECG dated 06/15/2021 was personally reviewed today and demonstrated:  Sinus bradycardia.  Labwork: February 2021: BUN 14, creatinine 0.73, potassium 4.1, AST 25, ALT 19    Component Value Date/Time   CHOL 146 04/10/2020 1022   CHOL 138 04/02/2016 1035   TRIG 41 04/10/2020 1022   HDL 72 04/10/2020 1022   HDL 63 04/02/2016 1035   CHOLHDL 2.0 04/10/2020 1022   VLDL 8 04/10/2020 1022   LDLCALC 66 04/10/2020 1022   LDLCALC 65 04/02/2016 1035   Other Studies Reviewed Today:  No interval cardiac testing for review today.  Assessment and Plan:  1.   Sense of leg swelling and mild weight gain over the last 6 months.  No orthopnea or PND.  No change in diet or sodium intake.  We will plan to update echocardiogram to ensure no change in LVEF.  Might consider adding HCTZ with reduction in lisinopril otherwise if LVEF remains normal.  2.  CAD with subtotally occluded distal LAD managed medically.  She reports no active angina or nitroglycerin use.  Continue aspirin, Coreg, lisinopril, and Pravachol.  3.  Mixed hyperlipidemia.  She continues on Pravachol with follow-up at [facility].  4.  Essential hypertension.  Blood pressure well-controlled today.  No changes were made.  Disposition:  Follow up  1 year.  Signed, Jonelle Sidle, M.D., F.A.C.C. [facility] at [facility]

## 2022-06-19 NOTE — Patient Instructions (Signed)
Medication Instructions:  Your physician recommends that you continue on your current medications as directed. Please refer to the Current Medication list given to you today.  *If you need a refill on your cardiac medications before your next appointment, please call your pharmacy*   Lab Work: None If you have labs (blood work) drawn today and your tests are completely normal, you will receive your results only by: MyChart Message (if you have MyChart) OR A paper copy in the mail If you have any lab test that is abnormal or we need to change your treatment, we will call you to review the results.   Testing/Procedures: Your physician has requested that you have an echocardiogram. Echocardiography is a painless test that uses sound waves to create images of your heart. It provides your doctor with information about the size and shape of your heart and how well your heart's chambers and valves are working. This procedure takes approximately one hour. There are no restrictions for this procedure. Please do NOT wear cologne, perfume, aftershave, or lotions (deodorant is allowed). Please arrive 15 minutes prior to your appointment time.    Follow-Up: At [facility], you and your health needs are our priority.  As part of our continuing mission to provide you with exceptional heart care, we have created designated Provider Care Teams.  These Care Teams include your primary Cardiologist (physician) and Advanced Practice Providers (APPs -  Physician Assistants and Nurse Practitioners) who all work together to provide you with the care you need, when you need it.  We recommend signing up for the patient portal called "MyChart".  Sign up information is provided on this After Visit Summary.  MyChart is used to connect with patients for Virtual Visits (Telemedicine).  Patients are able to view lab/test results, encounter notes, upcoming appointments, etc.  Non-urgent messages can be sent to your  provider as well.   To learn more about what you can do with MyChart, go to [email or web address].    Your next appointment:   1 year(s)  Provider:   Nona Dell, MD    Other Instructions

## 2022-06-23 ENCOUNTER — Other Ambulatory Visit: Payer: Self-pay | Admitting: Cardiology

## 2022-06-24 ENCOUNTER — Other Ambulatory Visit: Payer: Self-pay | Admitting: *Deleted

## 2022-06-24 MED ORDER — LISINOPRIL 5 MG PO TABS: 5.0000 mg | ORAL_TABLET | Freq: Every day | ORAL | 3 refills | Status: DC

## 2022-07-15 ENCOUNTER — Ambulatory Visit (HOSPITAL_COMMUNITY)
Admission: RE | Admit: 2022-07-15 | Discharge: 2022-07-15 | Disposition: A | Discharge status: Home / Self Care | Payer: Self-pay | Source: Ambulatory Visit | Attending: Cardiology | Admitting: Cardiology

## 2022-07-15 DIAGNOSIS — I25119 Atherosclerotic heart disease of native coronary artery with unspecified angina pectoris: Secondary | ICD-10-CM | POA: Insufficient documentation

## 2022-07-15 DIAGNOSIS — R600 Localized edema: Secondary | ICD-10-CM | POA: Insufficient documentation

## 2022-07-15 LAB — ECHOCARDIOGRAM COMPLETE
AR max vel: 2.26 cm2
AV Area VTI: 2.26 cm2
AV Area mean vel: 2.06 cm2
AV Mean grad: 3.5 mmHg
AV Peak grad: 6.8 mmHg
Ao pk vel: 1.31 m/s
Area-P 1/2: 2.91 cm2
P 1/2 time: 821 msec
S' Lateral: 2.3 cm

## 2022-07-15 NOTE — Progress Notes (Signed)
*  PRELIMINARY RESULTS* Echocardiogram 2D Echocardiogram has been performed.  Stacey Drain 07/15/2022, 10:10 AM

## 2022-07-16 ENCOUNTER — Telehealth: Payer: Self-pay

## 2022-07-16 DIAGNOSIS — Z79899 Other long term (current) drug therapy: Secondary | ICD-10-CM

## 2022-07-16 MED ORDER — HYDROCHLOROTHIAZIDE 25 MG PO TABS: 12.5000 mg | ORAL_TABLET | Freq: Every day | ORAL | 3 refills | Status: DC

## 2022-07-16 MED ORDER — LISINOPRIL 2.5 MG PO TABS: 2.5000 mg | ORAL_TABLET | Freq: Every day | ORAL | 3 refills | Status: DC

## 2022-07-16 NOTE — Telephone Encounter (Signed)
-----   Message from Jonelle Sidle, MD sent at 07/15/2022 12:49 PM EDT ----- Results reviewed.  Follow-up echocardiogram shows normal LVEF at 60 to 65%.  As per most recent office note, please check with her and see if she is still having trouble with ankle swelling.  If that is the case, we could start HCTZ 12.5 mg daily and reduce her lisinopril to 2.5 mg daily.  Would recheck a BMET 10 days thereafter.

## 2022-07-16 NOTE — Telephone Encounter (Signed)
Echo results discussed with patient.She still reports ankle swelling despite watching her salt and using compression stockings.She would like to take HCTZ 12.5 mg qd and she will reduce Lisinopril to 2.5 mg qd and get BMET at [facility] in 10 days.

## 2022-07-18 ENCOUNTER — Other Ambulatory Visit (HOSPITAL_COMMUNITY)
Admission: RE | Admit: 2022-07-18 | Discharge: 2022-07-18 | Disposition: A | Discharge status: Home / Self Care | Payer: Self-pay | Source: Ambulatory Visit | Attending: Cardiology | Admitting: Cardiology

## 2022-07-18 DIAGNOSIS — Z79899 Other long term (current) drug therapy: Secondary | ICD-10-CM | POA: Insufficient documentation

## 2022-07-18 LAB — BASIC METABOLIC PANEL
Anion gap: 9 (ref 5–15)
BUN: 12 mg/dL (ref 8–23)
CO2: 29 mmol/L (ref 22–32)
Calcium: 9.2 mg/dL (ref 8.9–10.3)
Chloride: 93 mmol/L — ABNORMAL LOW (ref 98–111)
Creatinine, Ser: 0.65 mg/dL (ref 0.44–1.00)
GFR, Estimated: >60 mL/min (ref >60)
Glucose, Bld: 64 mg/dL — ABNORMAL LOW (ref 70–99)
Potassium: 3.7 mmol/L (ref 3.5–5.1)
Sodium: 131 mmol/L — ABNORMAL LOW (ref 135–145)

## 2022-07-23 ENCOUNTER — Telehealth: Payer: Self-pay

## 2022-07-23 DIAGNOSIS — Z79899 Other long term (current) drug therapy: Secondary | ICD-10-CM

## 2022-07-23 NOTE — Telephone Encounter (Signed)
Results discussed with patient.She will repeat BMET at [facility] in 7-10 days,pcp copied.

## 2022-07-23 NOTE — Telephone Encounter (Signed)
-----   Message from Ellsworth Lennox, [place] sent at 07/23/2022  7:49 AM EDT ----- Covering for Dr. Diona Browner - Please let the patient know her kidney function remains stable following recent medication adjustments but sodium is low at 131. No recent values for comparison. Would repeat a BMET in 7-10 days for reassessment of her sodium level as we may need to stop HCTZ if this has not normalized.

## 2022-07-29 ENCOUNTER — Telehealth: Payer: Self-pay

## 2022-07-29 ENCOUNTER — Other Ambulatory Visit (HOSPITAL_COMMUNITY)
Admission: RE | Admit: 2022-07-29 | Discharge: 2022-07-29 | Disposition: A | Discharge status: Home / Self Care | Payer: Self-pay | Source: Ambulatory Visit | Attending: Student | Admitting: Student

## 2022-07-29 DIAGNOSIS — Z79899 Other long term (current) drug therapy: Secondary | ICD-10-CM | POA: Insufficient documentation

## 2022-07-29 LAB — BASIC METABOLIC PANEL
Anion gap: 7 (ref 5–15)
BUN: 12 mg/dL (ref 8–23)
CO2: 31 mmol/L (ref 22–32)
Calcium: 9.1 mg/dL (ref 8.9–10.3)
Chloride: 94 mmol/L — ABNORMAL LOW (ref 98–111)
Creatinine, Ser: 0.67 mg/dL (ref 0.44–1.00)
GFR, Estimated: >60 mL/min (ref >60)
Glucose, Bld: 66 mg/dL — ABNORMAL LOW (ref 70–99)
Potassium: 3.5 mmol/L (ref 3.5–5.1)
Sodium: 132 mmol/L — ABNORMAL LOW (ref 135–145)

## 2022-07-29 MED ORDER — HYDROCHLOROTHIAZIDE 25 MG PO TABS: 12.5000 mg | ORAL_TABLET | ORAL | 3 refills | Status: DC

## 2022-07-29 NOTE — Telephone Encounter (Signed)
I spoke with patient and she will decrease HCTZ 12.5 mg from qd to qod.

## 2022-07-29 NOTE — Telephone Encounter (Signed)
-----   Message from Jonelle Sidle, MD sent at 07/29/2022 12:34 PM EDT ----- Results reviewed.  Also noted recent lab work and review by Ms. Strader PA-C.  Sodium is up to 132, still mildly reduced.  Suggest cutting HCTZ to every other day.

## 2022-08-18 ENCOUNTER — Other Ambulatory Visit: Payer: Self-pay | Admitting: Cardiology

## 2022-08-25 ENCOUNTER — Other Ambulatory Visit: Payer: Self-pay | Admitting: Cardiology

## 2022-11-29 DIAGNOSIS — E782 Mixed hyperlipidemia: Secondary | ICD-10-CM | POA: Diagnosis not present

## 2022-11-29 DIAGNOSIS — Z0001 Encounter for general adult medical examination with abnormal findings: Secondary | ICD-10-CM | POA: Diagnosis not present

## 2022-11-29 DIAGNOSIS — I10 Essential (primary) hypertension: Secondary | ICD-10-CM | POA: Diagnosis not present

## 2022-11-29 DIAGNOSIS — Z6822 Body mass index (BMI) 22.0-22.9, adult: Secondary | ICD-10-CM | POA: Diagnosis not present

## 2022-11-29 DIAGNOSIS — I2510 Atherosclerotic heart disease of native coronary artery without angina pectoris: Secondary | ICD-10-CM | POA: Diagnosis not present

## 2022-12-19 DIAGNOSIS — Z85828 Personal history of other malignant neoplasm of skin: Secondary | ICD-10-CM | POA: Diagnosis not present

## 2022-12-19 DIAGNOSIS — D225 Melanocytic nevi of trunk: Secondary | ICD-10-CM | POA: Diagnosis not present

## 2022-12-19 DIAGNOSIS — Z08 Encounter for follow-up examination after completed treatment for malignant neoplasm: Secondary | ICD-10-CM | POA: Diagnosis not present

## 2022-12-19 DIAGNOSIS — L821 Other seborrheic keratosis: Secondary | ICD-10-CM | POA: Diagnosis not present

## 2023-03-31 DIAGNOSIS — I10 Essential (primary) hypertension: Secondary | ICD-10-CM | POA: Diagnosis not present

## 2023-03-31 DIAGNOSIS — R1084 Generalized abdominal pain: Secondary | ICD-10-CM | POA: Diagnosis not present

## 2023-03-31 DIAGNOSIS — Z6821 Body mass index (BMI) 21.0-21.9, adult: Secondary | ICD-10-CM | POA: Diagnosis not present

## 2023-04-01 DIAGNOSIS — R1084 Generalized abdominal pain: Secondary | ICD-10-CM | POA: Diagnosis not present

## 2023-04-13 ENCOUNTER — Emergency Department (HOSPITAL_COMMUNITY)
Admission: EM | Admit: 2023-04-13 | Discharge: 2023-04-13 | Disposition: A | Discharge status: Home / Self Care | Payer: Medicare Other | Attending: Emergency Medicine | Admitting: Emergency Medicine

## 2023-04-13 ENCOUNTER — Encounter (HOSPITAL_COMMUNITY): Payer: Self-pay | Admitting: Emergency Medicine

## 2023-04-13 ENCOUNTER — Other Ambulatory Visit: Payer: Self-pay

## 2023-04-13 DIAGNOSIS — N3001 Acute cystitis with hematuria: Secondary | ICD-10-CM | POA: Diagnosis not present

## 2023-04-13 DIAGNOSIS — N819 Female genital prolapse, unspecified: Secondary | ICD-10-CM | POA: Diagnosis not present

## 2023-04-13 DIAGNOSIS — R300 Dysuria: Secondary | ICD-10-CM | POA: Diagnosis present

## 2023-04-13 DIAGNOSIS — I10 Essential (primary) hypertension: Secondary | ICD-10-CM | POA: Insufficient documentation

## 2023-04-13 DIAGNOSIS — Z79899 Other long term (current) drug therapy: Secondary | ICD-10-CM | POA: Insufficient documentation

## 2023-04-13 DIAGNOSIS — Z7982 Long term (current) use of aspirin: Secondary | ICD-10-CM | POA: Diagnosis not present

## 2023-04-13 LAB — URINALYSIS, ROUTINE W REFLEX MICROSCOPIC
Bilirubin Urine: NEGATIVE
Glucose, UA: NEGATIVE mg/dL
Ketones, ur: NEGATIVE mg/dL
Nitrite: NEGATIVE
Protein, ur: 100 mg/dL — AB
RBC / HPF: >50 RBC/hpf (ref 0–5)
Specific Gravity, Urine: 1.024 (ref 1.005–1.030)
WBC, UA: >50 WBC/hpf (ref 0–5)
pH: 5 (ref 5.0–8.0)

## 2023-04-13 MED ORDER — CEPHALEXIN 500 MG PO CAPS
500.0000 mg | ORAL_CAPSULE | Freq: Once | ORAL | Status: AC
  Administered 2023-04-13: 500 mg via ORAL
  Filled 2023-04-13: qty 1

## 2023-04-13 MED ORDER — CEPHALEXIN 250 MG PO CAPS
250.0000 mg | ORAL_CAPSULE | Freq: Once | ORAL | Status: DC
  Filled 2023-04-13: qty 1

## 2023-04-13 MED ORDER — CEPHALEXIN 250 MG PO CAPS: 250.0000 mg | ORAL_CAPSULE | Freq: Four times a day (QID) | ORAL | 0 refills | Status: DC

## 2023-04-13 NOTE — ED Provider Notes (Signed)
[place] EMERGENCY DEPARTMENT AT [facility] Provider Note   CSN: [id number] Arrival date & time: 04/13/23  1922     History  Chief Complaint  Patient presents with   Dysuria    Erika Harrison is a 68 y.o. female with PMH as listed below who presents with c/o burning with urination that started today. No abdominal pain, flank pain. No N/V.  States she has dealt with what she thinks is a bladder prolapse for a while and she is able to urinate okay but states she has not had a chance to have it evaluated yet because she just recently obtained health insurance.  Past Medical History:  Diagnosis Date   Coronary atherosclerosis of native coronary artery    Subtotally occluded distal LAD   Depression with anxiety    Essential hypertension    Mitral valve prolapse 1995   Non-ST elevation MI (NSTEMI) (HCC)    Ulcerated plaque mid LAD 2/12 - managed medically       Home Medications Prior to Admission medications   Medication Sig Start Date End Date Taking? Authorizing Provider  cephALEXin (KEFLEX) 250 MG capsule Take 1 capsule (250 mg total) by mouth 4 (four) times daily. 04/13/23  Yes Loetta Rough, MD  acetaminophen (TYLENOL) 325 MG tablet Take 325 mg by mouth every 6 (six) hours as needed.    [provider]  aspirin 81 MG EC tablet Take 81 mg by mouth daily.    [provider]  carvedilol (COREG) 6.25 MG tablet Take 1 tablet by mouth twice daily 08/19/22   Jonelle Sidle, MD  clonazePAM (KLONOPIN) 1 MG tablet Take 0.25-1 mg by mouth 3 (three) times daily as needed for anxiety.    [provider]  famotidine (PEPCID) 20 MG tablet Take 1 tablet (20 mg total) by mouth 2 (two) times daily for 3 days. 08/19/18 06/15/48  Wurst, Lowanda Foster, PA-C  hydrochlorothiazide (HYDRODIURIL) 25 MG tablet Take 0.5 tablets (12.5 mg total) by mouth every other day. 07/29/22 10/27/22  Jonelle Sidle, MD  lisinopril (ZESTRIL) 2.5 MG tablet Take 1 tablet (2.5 mg  total) by mouth daily. 07/16/22 10/14/22  Jonelle Sidle, MD  Multiple Vitamin (MULTIVITAMIN WITH MINERALS) TABS tablet Take 1 tablet by mouth daily.    [provider]  nitroGLYCERIN (NITROSTAT) 0.4 MG SL tablet Place 1 tablet (0.4 mg total) under the tongue every 5 (five) minutes x 3 doses as needed for chest pain (if no relief after 3rd dose, proceed to the ED for an evaluation or call 911). 08/27/21   Jonelle Sidle, MD  Omega-3 Fatty Acids (FISH OIL) 1000 MG CAPS Take 1 capsule by mouth daily.    [provider]  PARoxetine (PAXIL) 40 MG tablet Take 40 mg by mouth daily. 05/03/21   [provider]  polycarbophil (FIBERCON) 625 MG tablet Take 625 mg by mouth daily.    [provider]  pravastatin (PRAVACHOL) 40 MG tablet TAKE 1 TABLET BY MOUTH ONCE DAILY IN THE EVENING 08/26/22   Jonelle Sidle, MD      Allergies    Patient has no known allergies.    Review of Systems   Review of Systems A 10 point review of systems was performed and is negative unless otherwise reported in HPI.  Physical Exam Updated Vital Signs BP (!) 143/72 (BP Location: Right Arm)   Pulse (!) 56   Temp 98.7 F (37.1 C) (Oral)   Resp 16  Ht 5\' 3"  (1.6 m)   Wt 59 kg   LMP 12/27/2010   SpO2 97%   BMI 23.03 kg/m  Physical Exam General: Normal appearing female, lying in bed.  HEENT: Sclera anicteric, MMM, trachea midline.  Cardiology: RRR Resp: Normal respiratory rate and effort.   Abd: Soft, non-tender, non-distended. No rebound tenderness or guarding.  GU: Unremarkable appearing external genitalia.  Fullness noted in the vaginal introitus without any erythema, induration, purulent drainage, or bleeding. MSK: No peripheral edema or signs of trauma. Extremities without deformity or TTP. No cyanosis or clubbing. Skin: warm, dry.  Back: No CVA tenderness Neuro: A&Ox4, CNs II-XII grossly intact. MAEs. Sensation grossly intact.  Psych: Very pleasant mood and affect.    ED Results / Procedures / Treatments   Labs (all labs ordered are listed, but only abnormal results are displayed) Labs Reviewed  URINALYSIS, ROUTINE W REFLEX MICROSCOPIC - Abnormal; Notable for the following components:      Result Value   APPearance CLOUDY (*)    Hgb urine dipstick LARGE (*)    Protein, ur 100 (*)    Leukocytes,Ua LARGE (*)    Bacteria, UA RARE (*)    Non Squamous Epithelial 0-5 (*)    All other components within normal limits    EKG None  Radiology No results found.  Procedures Procedures    Medications Ordered in ED Medications  cephALEXin (KEFLEX) capsule 250 mg (has no administration in time range)    ED Course/ Medical Decision Making/ A&P                          Medical Decision Making Amount and/or Complexity of Data Reviewed Labs: ordered.   MDM:    Patient with UA positive for UTI.  Urinated without difficulty.  No abdominal tenderness palpation or peritonitis on exam to indicate other intra-abdominal infection.  No flank pain to indicate pyelonephritis or ureterolithiasis.  She is very well-appearing and afebrile, no concern for sepsis or life-threatening infection.  Will give patient a dose of Keflex here and discharged with prescription for Keflex.  She has no nausea vomiting, she is able to take p.o.  Also recommended follow-up for her pelvic organ prolapse; will give her the number to call for the urogynecology stay in [place] but also recommended she see her primary care physician as well within the next 1 to 2 weeks.  Clinical Course as of 04/13/23 2202  Wynelle Link Apr 13, 2023  2147 Urinalysis, Routine w reflex microscopic -Urine, Clean Catch(!) +UTI [HN]    Clinical Course User Index [HN] Loetta Rough, MD    Labs: UA ordered in triage  Additional history obtained from chart review, husband at bedside.  Social Determinants of Health:  lives independently  Disposition:  DC w/ discharge instructions/return precautions.  All questions answered to patient's satisfaction.    Co morbidities that complicate the patient evaluation  Past Medical History:  Diagnosis Date   Coronary atherosclerosis of native coronary artery    Subtotally occluded distal LAD   Depression with anxiety    Essential hypertension    Mitral valve prolapse 1995   Non-ST elevation MI (NSTEMI) (HCC)    Ulcerated plaque mid LAD 2/12 - managed medically     Medicines Meds ordered this encounter  Medications   cephALEXin (KEFLEX) capsule 250 mg   cephALEXin (KEFLEX) 250 MG capsule    Sig: Take 1 capsule (250 mg total) by mouth 4 (four) times  daily.    Dispense:  28 capsule    Refill:  0    I have reviewed the patients home medicines and have made adjustments as needed  Problem List / ED Course: Problem List Items Addressed This Visit   None Visit Diagnoses       Acute cystitis with hematuria    -  Primary     Female genital prolapse, unspecified type                       This note was created using dictation software, which may contain spelling or grammatical errors.    Loetta Rough, MD 04/13/23 2204

## 2023-04-13 NOTE — Discharge Instructions (Addendum)
Thank you for coming to [facility] Emergency Department. You were seen for burning with urination. We did an exam, labs, and these showed a urinary tract infection. Please take keflex 250 mg four times per day for 7 days. Please call to follow up with a urogynecologist about your pelvic organ prolapse.   Please follow up with your primary care provider within 1 week.   Do not hesitate to return to the ED or call 911 if you experience: -Worsening symptoms -Lightheadedness, passing out -Inability to urinate -Fevers/chills -Anything else that concerns you

## 2023-04-13 NOTE — ED Notes (Signed)
 Reviewed D/C information with the patient, pt verbalized understanding. No additional concerns at this time.

## 2023-04-13 NOTE — ED Triage Notes (Signed)
Pt with c/o burning with urination that started today. Denies any other symptoms.

## 2023-04-28 DIAGNOSIS — N390 Urinary tract infection, site not specified: Secondary | ICD-10-CM | POA: Diagnosis not present

## 2023-04-28 DIAGNOSIS — Z6822 Body mass index (BMI) 22.0-22.9, adult: Secondary | ICD-10-CM | POA: Diagnosis not present

## 2023-05-21 ENCOUNTER — Other Ambulatory Visit: Payer: Self-pay | Admitting: Cardiology

## 2023-07-07 ENCOUNTER — Telehealth: Payer: Self-pay | Admitting: Cardiology

## 2023-07-07 MED ORDER — CARVEDILOL 6.25 MG PO TABS: 6.2500 mg | ORAL_TABLET | Freq: Two times a day (BID) | ORAL | 3 refills | Status: DC

## 2023-07-07 NOTE — Telephone Encounter (Signed)
 Refill completed.

## 2023-07-07 NOTE — Telephone Encounter (Signed)
 Pt came into office stating she needs a refill on her carvedilol  (COREG ) 6.25 MG tablet sent to [facility] in [place].

## 2023-07-23 ENCOUNTER — Telehealth: Payer: Self-pay | Admitting: Cardiology

## 2023-07-23 ENCOUNTER — Ambulatory Visit: Payer: Self-pay | Admitting: Cardiology

## 2023-07-23 DIAGNOSIS — R6889 Other general symptoms and signs: Secondary | ICD-10-CM

## 2023-07-23 NOTE — Telephone Encounter (Signed)
 Attempt to reach at number given, goes direct to answering machine, voicemail full.   She gave front office staff a sheet from a provider who requested she bring to her PCP at the next visit. :Circulation screening "[employer]"    This has been scanned to provider and will await his recommendations.

## 2023-07-23 NOTE — Telephone Encounter (Signed)
 Pt came in office and stated she had a doppler done by a home health nurse. She brought in the results and was concerned. She wanted to know if Dr. Londa Rival thinks she should go to the ER. I have scanned it and sent it to Dr. Londa Rival. [phone] is the best number to reach the pt.

## 2023-07-23 NOTE — Telephone Encounter (Signed)
 Noted,referral placed by L.Anderson,RN

## 2023-07-28 ENCOUNTER — Other Ambulatory Visit: Payer: Self-pay | Admitting: *Deleted

## 2023-07-28 DIAGNOSIS — I739 Peripheral vascular disease, unspecified: Secondary | ICD-10-CM

## 2023-07-29 ENCOUNTER — Ambulatory Visit: Payer: Medicare Other | Admitting: Obstetrics

## 2023-08-06 ENCOUNTER — Ambulatory Visit (HOSPITAL_COMMUNITY)
Admission: RE | Admit: 2023-08-06 | Discharge: 2023-08-06 | Disposition: A | Discharge status: Home / Self Care | Source: Ambulatory Visit | Attending: Vascular Surgery | Admitting: Vascular Surgery

## 2023-08-06 DIAGNOSIS — I739 Peripheral vascular disease, unspecified: Secondary | ICD-10-CM

## 2023-08-06 LAB — VAS US ABI WITH/WO TBI
Left ABI: 1.12
Right ABI: 1.11

## 2023-08-06 NOTE — Progress Notes (Signed)
 Office Note     CC:  Abnormal ABI Requesting Provider:  Gerard Knight, MD  HPI: Erika Harrison is a 68 y.o. (18-Jan-1956) female presenting at the request of .[name], MD for evaluation of abnormal ABI.  [place] was seen by home health insurance agency and underwent ABI which was very low.  She was referred to our office due to this abnormal ABI.  On exam, [name] was doing well, accompanied by her daughter.  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 .  She is now retired, and continues to live an active lifestyle.  She lives with her daughter and enjoys taking long walks pushing her dog Mr. [name] [facility] mix) in a carrier.  She has no symptoms of claudication.  No ischemic rest pain, no tissue loss. She notes at night sometimes she has some cramping in the arches of her feet that resolves without issue.  Feels as though she is well-hydrated throughout the day.  Has noted a few episodes of discoloration in the toes which resolved.  This usually occurs in the winter when her feet are cold.  Non-smoker  Past Medical History:  Diagnosis Date   Coronary atherosclerosis of native coronary artery    Subtotally occluded distal LAD   Depression with anxiety    Essential hypertension    Mitral valve prolapse 1995   Non-ST elevation MI (NSTEMI) (HCC)    Ulcerated plaque mid LAD 2/12 - managed medically    Past Surgical History:  Procedure Laterality Date   Laparoscopic abdominal surgery     Right breast surgery      Social History   Socioeconomic History   Marital status: Married    Spouse name: Not on file   Number of children: Not on file   Years of education: Not on file   Highest education level: Not on file  Occupational History   Not on file  Tobacco Use   Smoking status: Never   Smokeless tobacco: Never  Vaping Use   Vaping status: Never Used  Substance and Sexual Activity   Alcohol use: No   Drug  use: No   Sexual activity: Not on file  Other Topics Concern   Not on file  Social History Narrative               Social Drivers of Health   Financial Resource Strain: Not on file  Food Insecurity: Not on file  Transportation Needs: Not on file  Physical Activity: Not on file  Stress: Not on file  Social Connections: Not on file  Intimate Partner Violence: Not on file   Family History  Problem Relation Age of Onset   Cancer Father        Renal cell carcinoma   Rheum arthritis Brother    Hypertension Sister    Drug abuse Brother     Current Outpatient Medications  Medication Sig Dispense Refill   acetaminophen  (TYLENOL ) 325 MG tablet Take 325 mg by mouth every 6 (six) hours as needed.     aspirin  81 MG EC tablet Take 81 mg by mouth daily.     carvedilol  (COREG ) 6.25 MG tablet Take 1 tablet (6.25 mg total) by mouth 2 (two) times daily. 180 tablet 3   cephALEXin  (KEFLEX ) 250 MG capsule Take 1 capsule (250 mg total) by mouth 4 (four) times daily. 28 capsule 0   clonazePAM  (KLONOPIN ) 1 MG tablet Take 0.25-1 mg by mouth  3 (three) times daily as needed for anxiety.     famotidine  (PEPCID ) 20 MG tablet Take 1 tablet (20 mg total) by mouth 2 (two) times daily for 3 days. 6 tablet 0   hydrochlorothiazide  (HYDRODIURIL ) 25 MG tablet Take 0.5 tablets (12.5 mg total) by mouth every other day. 45 tablet 3   lisinopril  (ZESTRIL ) 2.5 MG tablet Take 1 tablet (2.5 mg total) by mouth daily. 90 tablet 3   Multiple Vitamin (MULTIVITAMIN WITH MINERALS) TABS tablet Take 1 tablet by mouth daily.     nitroGLYCERIN  (NITROSTAT ) 0.4 MG SL tablet Place 1 tablet (0.4 mg total) under the tongue every 5 (five) minutes x 3 doses as needed for chest pain (if no relief after 3rd dose, proceed to the ED for an evaluation or call 911). 25 tablet 3   Omega-3 Fatty Acids (FISH OIL) 1000 MG CAPS Take 1 capsule by mouth daily.     PARoxetine  (PAXIL ) 40 MG tablet Take 40 mg by mouth daily.     polycarbophil  (FIBERCON) 625 MG tablet Take 625 mg by mouth daily.     pravastatin  (PRAVACHOL ) 40 MG tablet TAKE 1 TABLET BY MOUTH ONCE DAILY IN THE EVENING 90 tablet 3   No current facility-administered medications for this visit.    No Known Allergies   REVIEW OF SYSTEMS:  [X]  denotes positive finding, [ ]  denotes negative finding Cardiac  Comments:  Chest pain or chest pressure:    Shortness of breath upon exertion:    Short of breath when lying flat:    Irregular heart rhythm:        Vascular    Pain in calf, thigh, or hip brought on by ambulation:    Pain in feet at night that wakes you up from your sleep:     Blood clot in your veins:    Leg swelling:         Pulmonary    Oxygen at home:    Productive cough:     Wheezing:         Neurologic    Sudden weakness in arms or legs:     Sudden numbness in arms or legs:     Sudden onset of difficulty speaking or slurred speech:    Temporary loss of vision in one eye:     Problems with dizziness:         Gastrointestinal    Blood in stool:     Vomited blood:         Genitourinary    Burning when urinating:     Blood in urine:        Psychiatric    Major depression:         Hematologic    Bleeding problems:    Problems with blood clotting too easily:        Skin    Rashes or ulcers:        Constitutional    Fever or chills:      PHYSICAL EXAMINATION:  There were no vitals filed for this visit.  General:  WDWN in NAD; vital signs documented above Gait: Not observed HENT: WNL, normocephalic Pulmonary: normal non-labored breathing , without wheezing Cardiac: regular HR Abdomen: soft, NT, no masses Skin: without rashes Vascular Exam/Pulses:  Right Left  Radial 2+ (normal) 2+ (normal)  Ulnar    Femoral    Popliteal    DP 2+ (normal) 2+ (normal)  PT     Extremities: without ischemic changes, without  Gangrene , without cellulitis; without open wounds;  Musculoskeletal: no muscle wasting or atrophy  Neurologic:  A&O X 3;  No focal weakness or paresthesias are detected Psychiatric:  The pt has Normal affect.   Non-Invasive Vascular Imaging:   ABI Findings:  +---------+------------------+-----+-----------+--------+  Right   Rt Pressure (mmHg)IndexWaveform   Comment   +---------+------------------+-----+-----------+--------+  Brachial 125                                         +---------+------------------+-----+-----------+--------+  PTA     139               1.09 multiphasic          +---------+------------------+-----+-----------+--------+  DP      141               1.11 biphasic             +---------+------------------+-----+-----------+--------+  [facility]               0.88 Normal               +---------+------------------+-----+-----------+--------+   +---------+------------------+-----+--------+-------+  Left    Lt Pressure (mmHg)IndexWaveformComment  +---------+------------------+-----+--------+-------+  Brachial 127                                     +---------+------------------+-----+--------+-------+  PTA     142               1.12 biphasic         +---------+------------------+-----+--------+-------+  DP      140               1.10 biphasic         +---------+------------------+-----+--------+-------+  Suella Emmer Toe76                0.60 Abnormal         +---------+------------------+-----+--------+-------+     ASSESSMENT/PLAN: JANNE FAULK is a 68 y.o. female presenting with abnormal ABI study.  On exam, she had palpable pulses in the feet.  Denied history of claudication, ischemic rest pain, tissue loss. ABI was reviewed demonstrating near-normal perfusion bilaterally.  She does have a small amount of small vessel disease in the left great toe which is consistent with the discoloration that she has intermittently with cold.  I had a long discussion with Holliday regarding the above.  At this time I am not  concerned about her level of peripheral arterial disease.  She would benefit from keeping her feet warm.  I do not think she needs Raynaud's workup at this time as symptoms are intermittent and mild.  Raylen can follow-up me as needed.  I asked her to call my office should new onset claudication, ischemic rest pain, tissue loss occur.   Kayla Part, MD [facility] [phone] Total time of patient care including pre-visit research, consultation, and documentation greater than 45 minutes

## 2023-08-07 ENCOUNTER — Encounter: Payer: Self-pay | Admitting: Vascular Surgery

## 2023-08-07 ENCOUNTER — Ambulatory Visit: Attending: Vascular Surgery | Admitting: Vascular Surgery

## 2023-08-07 VITALS — BP 105/79 | HR 52 | Temp 97.9°F | Resp 18 | Ht 63.0 in | Wt 124.9 lb

## 2023-08-07 DIAGNOSIS — I739 Peripheral vascular disease, unspecified: Secondary | ICD-10-CM | POA: Diagnosis not present

## 2023-09-04 ENCOUNTER — Ambulatory Visit: Attending: Cardiology | Admitting: Cardiology

## 2023-09-04 ENCOUNTER — Encounter: Payer: Self-pay | Admitting: Cardiology

## 2023-09-04 VITALS — BP 116/78 | HR 58 | Ht 62.0 in | Wt 123.0 lb

## 2023-09-04 DIAGNOSIS — I10 Essential (primary) hypertension: Secondary | ICD-10-CM | POA: Diagnosis not present

## 2023-09-04 DIAGNOSIS — E782 Mixed hyperlipidemia: Secondary | ICD-10-CM

## 2023-09-04 DIAGNOSIS — I25119 Atherosclerotic heart disease of native coronary artery with unspecified angina pectoris: Secondary | ICD-10-CM

## 2023-09-04 MED ORDER — CARVEDILOL 6.25 MG PO TABS: 6.2500 mg | ORAL_TABLET | Freq: Two times a day (BID) | ORAL | 3 refills | Status: AC

## 2023-09-04 NOTE — Patient Instructions (Signed)
 Medication Instructions:  Your physician recommends that you continue on your current medications as directed. Please refer to the Current Medication list given to you today.   Labwork: None today  Testing/Procedures: None today  Follow-Up: 1 year  Any Other Special Instructions Will Be Listed Below (If Applicable).  If you need a refill on your cardiac medications before your next appointment, please call your pharmacy.

## 2023-09-04 NOTE — Progress Notes (Signed)
    Cardiology Office Note  Date: 09/04/2023   ID: CLOVIS WARWICK, DOB 1955-12-22, MRN [id number]  History of Present Illness: Erika Harrison is a 68 y.o. female last seen in April 2024.  She is here for a routine visit.  States that she has been doing well, walks 25 minutes a day with her dog, no exertional chest pain or unusual shortness of breath.  No claudication.  She states that prior leg heaviness/swelling has improved following addition of HCTZ to her regimen.  She did have interval consultation with Dr. Lanis with [facility] given concern for abnormal ABIs obtained by home health nurse screening.  Ultimately it looks like repeat measurements show normal range bilateral ABIs with only an abnormal left TBI which was addressed at vascular consultation.  We went over her medications.  She reports no intolerances, I reviewed her interval lab work which is noted below.  I reviewed her ECG today which shows sinus bradycardia.  Physical Exam: VS:  BP 116/78 (BP Location: Left Arm, Cuff Size: Normal)   Pulse (!) 58   Ht 5' 2 (1.575 m)   Wt 123 lb (55.8 kg)   LMP 12/27/2010   SpO2 97%   BMI 22.50 kg/m , BMI Body mass index is 22.5 kg/m.  Wt Readings from Last 3 Encounters:  09/04/23 123 lb (55.8 kg)  08/07/23 124 lb 14.4 oz (56.7 kg)  04/13/23 130 lb (59 kg)    General: Patient appears comfortable at rest. HEENT: Conjunctiva and lids normal. Neck: Supple, no elevated JVP or carotid bruits. Lungs: Clear to auscultation, nonlabored breathing at rest. Cardiac: Regular rate and rhythm, no S3 or significant systolic murmur. Extremities: No pitting edema.  ECG:  An ECG dated 06/19/2022 was personally reviewed today and demonstrated:  Sinus rhythm with low voltage.  Labwork:  October 2024: Hemoglobin 13.9, platelets 259, BUN 13, creatinine 0.77, GFR 86, potassium 4.3, AST 28, ALT 19, cholesterol 160, triglycerides 79, HDL 77, LDL 68  Other Studies Reviewed Today:  No interval  cardiac testing for review today.  Assessment and Plan:  1.  CAD with subtotally occluded distal LAD managed medically.  LVEF 60 to 65% by echocardiogram in May 2024.  She reports no angina, walking regularly for exercise.  I reviewed her ECG.  Continue aspirin  81 mg daily, Pravachol  40 mg daily, and as needed nitroglycerin .   2.  Mixed hyperlipidemia.  HDL 77 and LDL 68 in October 2024.  Continue Pravachol  40 mg daily.   3.  Primary hypertension.  Continue Coreg  6.25 mg twice daily, HCTZ 12.5 mg daily, and lisinopril  2.5 mg daily.  Disposition:  Follow up 1 year.  Signed, Jayson JUDITHANN Sierras, M.D., F.A.C.C. [facility]

## 2023-09-15 DIAGNOSIS — E785 Hyperlipidemia, unspecified: Secondary | ICD-10-CM | POA: Diagnosis not present

## 2023-09-15 DIAGNOSIS — N8110 Cystocele, unspecified: Secondary | ICD-10-CM | POA: Diagnosis not present

## 2023-09-15 DIAGNOSIS — Z7689 Persons encountering health services in other specified circumstances: Secondary | ICD-10-CM | POA: Diagnosis not present

## 2023-09-15 DIAGNOSIS — I10 Essential (primary) hypertension: Secondary | ICD-10-CM | POA: Diagnosis not present

## 2023-09-15 DIAGNOSIS — Z1211 Encounter for screening for malignant neoplasm of colon: Secondary | ICD-10-CM | POA: Diagnosis not present

## 2023-09-15 DIAGNOSIS — I252 Old myocardial infarction: Secondary | ICD-10-CM | POA: Diagnosis not present

## 2023-09-24 DIAGNOSIS — Z1211 Encounter for screening for malignant neoplasm of colon: Secondary | ICD-10-CM | POA: Diagnosis not present

## 2023-10-01 DIAGNOSIS — I10 Essential (primary) hypertension: Secondary | ICD-10-CM | POA: Diagnosis not present

## 2023-10-02 LAB — COLOGUARD: COLOGUARD: NEGATIVE

## 2023-10-06 DIAGNOSIS — I252 Old myocardial infarction: Secondary | ICD-10-CM | POA: Diagnosis not present

## 2023-10-06 DIAGNOSIS — I10 Essential (primary) hypertension: Secondary | ICD-10-CM | POA: Diagnosis not present

## 2023-10-06 DIAGNOSIS — Z2821 Immunization not carried out because of patient refusal: Secondary | ICD-10-CM | POA: Diagnosis not present

## 2023-10-06 DIAGNOSIS — N8110 Cystocele, unspecified: Secondary | ICD-10-CM | POA: Diagnosis not present

## 2023-10-06 DIAGNOSIS — Z79899 Other long term (current) drug therapy: Secondary | ICD-10-CM | POA: Diagnosis not present

## 2023-10-06 DIAGNOSIS — E785 Hyperlipidemia, unspecified: Secondary | ICD-10-CM | POA: Diagnosis not present

## 2023-10-06 DIAGNOSIS — I119 Hypertensive heart disease without heart failure: Secondary | ICD-10-CM | POA: Diagnosis not present

## 2023-10-07 ENCOUNTER — Other Ambulatory Visit: Payer: Self-pay | Admitting: Cardiology

## 2023-10-10 DIAGNOSIS — L0202 Furuncle of face: Secondary | ICD-10-CM | POA: Diagnosis not present

## 2023-10-17 ENCOUNTER — Ambulatory Visit
Admission: EM | Admit: 2023-10-17 | Discharge: 2023-10-17 | Disposition: A | Discharge status: Home / Self Care | Attending: Family Medicine | Admitting: Family Medicine

## 2023-10-17 DIAGNOSIS — R21 Rash and other nonspecific skin eruption: Secondary | ICD-10-CM | POA: Diagnosis not present

## 2023-10-17 DIAGNOSIS — K121 Other forms of stomatitis: Secondary | ICD-10-CM

## 2023-10-17 DIAGNOSIS — Z889 Allergy status to unspecified drugs, medicaments and biological substances status: Secondary | ICD-10-CM

## 2023-10-17 MED ORDER — CETIRIZINE HCL 10 MG PO TABS: 10.0000 mg | ORAL_TABLET | Freq: Every day | ORAL | 0 refills | Status: DC

## 2023-10-17 MED ORDER — LIDOCAINE VISCOUS HCL 2 % MT SOLN: 10.0000 mL | OROMUCOSAL | 0 refills | Status: DC | PRN

## 2023-10-17 MED ORDER — CHLORHEXIDINE GLUCONATE 0.12 % MT SOLN: 15.0000 mL | Freq: Two times a day (BID) | OROMUCOSAL | 0 refills | Status: DC

## 2023-10-17 MED ORDER — FAMOTIDINE 40 MG PO TABS: 40.0000 mg | ORAL_TABLET | Freq: Every day | ORAL | 0 refills | Status: AC

## 2023-10-17 NOTE — ED Triage Notes (Addendum)
 Pt states she was taking Bactrim  for MRSA and she now has a rash on her abdomen and sores on her tongue.

## 2023-10-17 NOTE — Discharge Instructions (Signed)
 I have added Bactrim to your allergy list in case this is what has caused her symptoms.  Avoid taking this medication going forward in case you are truly allergic.  I have prescribed Zyrtec  and Pepcid  to help with an allergic reaction which should hopefully resolve the rash and some mouth rinse and some liquid numbing agent to help with the mouth ulcers.  Follow-up for worsening symptoms.

## 2023-10-17 NOTE — ED Provider Notes (Signed)
 [facility]    CSN: [id number] Arrival date & time: 10/17/23  9147      History   Chief Complaint Chief Complaint  Patient presents with   Allergic Reaction    HPI Erika Harrison is a 68 y.o. female.   Patient presenting today with several day history of painful mouth ulcers and rash across abdomen that started 1 day into a course of Bactrim.  She states she was given the Bactrim last week for MRSA lesions on her chin.  She did complete the medication not putting 2 and 2 together but believes she may be allergic to the medication.  She continues to have the symptoms but notes they are not worsening.  So far trying salt water gargles, peroxide rinses with minimal relief.  Denies throat itching or swelling, chest tightness, shortness of breath, nausea, vomiting, abdominal pain.  No past history of drug allergies.  No other new changes including new foods, medications, supplements or other exposures.    Past Medical History:  Diagnosis Date   Coronary atherosclerosis of native coronary artery    Subtotally occluded distal LAD   Depression with anxiety    Essential hypertension    Mitral valve prolapse 1995   Non-ST elevation MI (NSTEMI) (HCC)    Ulcerated plaque mid LAD 2/12 - managed medically    Patient Active Problem List   Diagnosis Date Noted   Palpitations 12/21/2013   Essential hypertension, benign    Mixed hyperlipidemia 04/26/2010   Coronary atherosclerosis of native coronary artery 04/26/2010    Past Surgical History:  Procedure Laterality Date   Laparoscopic abdominal surgery     Right breast surgery      OB History   No obstetric history on file.      Home Medications    Prior to Admission medications   Medication Sig Start Date End Date Taking? Authorizing Provider  cetirizine  (ZYRTEC  ALLERGY) 10 MG tablet Take 1 tablet (10 mg total) by mouth daily. 10/17/23  Yes Stuart Vernell Norris, PA-C  chlorhexidine  (PERIDEX ) 0.12 % solution  Use as directed 15 mLs in the mouth or throat 2 (two) times daily. 10/17/23  Yes Stuart Vernell Norris, PA-C  famotidine  (PEPCID ) 40 MG tablet Take 1 tablet (40 mg total) by mouth daily. 10/17/23  Yes Stuart Vernell Norris, PA-C  lidocaine  (XYLOCAINE ) 2 % solution Use as directed 10 mLs in the mouth or throat every 3 (three) hours as needed. 10/17/23  Yes Stuart Vernell Norris, PA-C  acetaminophen  (TYLENOL ) 325 MG tablet Take 325 mg by mouth every 6 (six) hours as needed.    [provider]  aspirin  81 MG EC tablet Take 81 mg by mouth daily.    [provider]  carvedilol  (COREG ) 6.25 MG tablet Take 1 tablet (6.25 mg total) by mouth 2 (two) times daily. 09/04/23   Debera Jayson MATSU, MD  cephALEXin  (KEFLEX ) 250 MG capsule Take 1 capsule (250 mg total) by mouth 4 (four) times daily. 04/13/23   Franklyn Sid SAILOR, MD  clonazePAM  (KLONOPIN ) 1 MG tablet Take 0.25-1 mg by mouth 3 (three) times daily as needed for anxiety.    [provider]  famotidine  (PEPCID ) 20 MG tablet Take 1 tablet (20 mg total) by mouth 2 (two) times daily for 3 days. 08/19/18 06/15/48  Wurst, Laymon, PA-C  hydrochlorothiazide  (HYDRODIURIL ) 25 MG tablet Take 0.5 tablets (12.5 mg total) by mouth every other day. 07/29/22 09/04/23  Debera Jayson MATSU, MD  lisinopril  (ZESTRIL ) 2.5 MG  tablet Take 1 tablet by mouth once daily 10/09/23   McDowell, Samuel G, MD  Multiple Vitamin (MULTIVITAMIN WITH MINERALS) TABS tablet Take 1 tablet by mouth daily.    [provider]  nitroGLYCERIN  (NITROSTAT ) 0.4 MG SL tablet Place 1 tablet (0.4 mg total) under the tongue every 5 (five) minutes x 3 doses as needed for chest pain (if no relief after 3rd dose, proceed to the ED for an evaluation or call 911). 08/27/21   Debera Jayson MATSU, MD  Omega-3 Fatty Acids (FISH OIL) 1000 MG CAPS Take 1 capsule by mouth daily. Patient not taking: Reported on 09/04/2023    [provider]  PARoxetine  (PAXIL ) 40 MG tablet Take 40 mg by  mouth daily. 05/03/21   [provider]  polycarbophil (FIBERCON) 625 MG tablet Take 625 mg by mouth daily.    [provider]  pravastatin  (PRAVACHOL ) 40 MG tablet TAKE 1 TABLET BY MOUTH ONCE DAILY IN THE EVENING 05/21/23   Debera Jayson MATSU, MD    Family History Family History  Problem Relation Age of Onset   Cancer Father        Renal cell carcinoma   Rheum arthritis Brother    Hypertension Sister    Drug abuse Brother     Social History Social History   Tobacco Use   Smoking status: Never   Smokeless tobacco: Never  Vaping Use   Vaping status: Never Used  Substance Use Topics   Alcohol use: No   Drug use: No     Allergies   Bactrim [sulfamethoxazole-trimethoprim]   Review of Systems Review of Systems Per HPI  Physical Exam Triage Vital Signs ED Triage Vitals  Encounter Vitals Group     BP 10/17/23 0857 115/81     Girls Systolic BP Percentile --      Girls Diastolic BP Percentile --      Boys Systolic BP Percentile --      Boys Diastolic BP Percentile --      Pulse Rate 10/17/23 0857 71     Resp 10/17/23 0857 16     Temp 10/17/23 0857 98.8 F (37.1 C)     Temp Source 10/17/23 0857 Oral     SpO2 10/17/23 0857 97 %     Weight --      Height --      Head Circumference --      Peak Flow --      Pain Score 10/17/23 0904 0     Pain Loc --      Pain Education --      Exclude from Growth Chart --    No data found.  Updated Vital Signs BP 115/81 (BP Location: Right Arm)   Pulse 71   Temp 98.8 F (37.1 C) (Oral)   Resp 16   LMP 12/27/2010   SpO2 97%   Visual Acuity Right Eye Distance:   Left Eye Distance:   Bilateral Distance:    Right Eye Near:   Left Eye Near:    Bilateral Near:     Physical Exam Vitals and nursing note reviewed.  Constitutional:      Appearance: Normal appearance. She is not ill-appearing.  HENT:     Head: Atraumatic.     Mouth/Throat:     Mouth: Mucous membranes are moist.     Comments: Multiple  ulcerations to the base of tongue, oral mucosa Eyes:     Extraocular Movements: Extraocular movements intact.     Conjunctiva/sclera:  Conjunctivae normal.  Cardiovascular:     Rate and Rhythm: Normal rate and regular rhythm.     Heart sounds: Normal heart sounds.  Pulmonary:     Effort: Pulmonary effort is normal.     Breath sounds: Normal breath sounds.  Musculoskeletal:        General: Normal range of motion.     Cervical back: Normal range of motion and neck supple.  Skin:    General: Skin is warm and dry.     Findings: Rash present.     Comments: Pinpoint papular erythematous rash diffusely across abdomen  Neurological:     Mental Status: She is alert and oriented to person, place, and time.  Psychiatric:        Mood and Affect: Mood normal.        Thought Content: Thought content normal.        Judgment: Judgment normal.      UC Treatments / Results  Labs (all labs ordered are listed, but only abnormal results are displayed) Labs Reviewed - No data to display  EKG   Radiology No results found.  Procedures Procedures (including critical care time)  Medications Ordered in UC Medications - No data to display  Initial Impression / Assessment and Plan / UC Course  I have reviewed the triage vital signs and the nursing notes.  Pertinent labs & imaging results that were available during my care of the patient were reviewed by me and considered in my medical decision making (see chart for details).     Given the timeframe, suspect drug allergy to Bactrim.  Added to allergy list, discussed with patient to avoid taking this going forward as we cannot prove that it was not related to the Bactrim.  Will treat mouth ulcers with Peridex  and viscous lidocaine , Zyrtec  and Pepcid  for rash and general suspected allergic reaction.  Return for worsening or unresolving symptoms.  Final Clinical Impressions(s) / UC Diagnoses   Final diagnoses:  Mouth ulcer  Rash  Drug allergy      Discharge Instructions      I have added Bactrim to your allergy list in case this is what has caused her symptoms.  Avoid taking this medication going forward in case you are truly allergic.  I have prescribed Zyrtec  and Pepcid  to help with an allergic reaction which should hopefully resolve the rash and some mouth rinse and some liquid numbing agent to help with the mouth ulcers.  Follow-up for worsening symptoms.    ED Prescriptions     Medication Sig Dispense Auth. Provider   chlorhexidine  (PERIDEX ) 0.12 % solution Use as directed 15 mLs in the mouth or throat 2 (two) times daily. 120 mL Stuart Vernell Norris, PA-C   lidocaine  (XYLOCAINE ) 2 % solution Use as directed 10 mLs in the mouth or throat every 3 (three) hours as needed. 100 mL Stuart Vernell Norris, PA-C   cetirizine  (ZYRTEC  ALLERGY) 10 MG tablet Take 1 tablet (10 mg total) by mouth daily. 10 tablet Stuart Vernell Norris, PA-C   famotidine  (PEPCID ) 40 MG tablet Take 1 tablet (40 mg total) by mouth daily. 10 tablet Stuart Vernell Norris, [place]      PDMP not reviewed this encounter.   Stuart Vernell Norris, [place] 10/17/23 1005

## 2024-03-02 ENCOUNTER — Telehealth: Payer: Self-pay | Admitting: Cardiology

## 2024-03-02 NOTE — Telephone Encounter (Signed)
" °*  STAT* If patient is at the pharmacy, call can be transferred to refill team.   1. Which medications need to be refilled? (please list name of each medication and dose if known  hydrochlorothiazide  (HYDRODIURIL ) 25 MG tablet [[id number]]  ENDED  Take 0.5 tablets (12.5 mg total) by mouth every other day.    4. Which pharmacy/location (including street and city if local pharmacy) is medication to be sent to?  [place]   5. Do they need a 30 day or 90 day supply?  90 day  "

## 2024-03-04 ENCOUNTER — Telehealth: Payer: Self-pay | Admitting: Cardiology

## 2024-03-04 MED ORDER — HYDROCHLOROTHIAZIDE 25 MG PO TABS: 12.5000 mg | ORAL_TABLET | ORAL | 1 refills | Status: DC

## 2024-03-04 MED ORDER — HYDROCHLOROTHIAZIDE 25 MG PO TABS: 12.5000 mg | ORAL_TABLET | ORAL | 1 refills | Status: AC

## 2024-03-04 NOTE — Telephone Encounter (Signed)
 Pt's medication was resent to pt's pharmacy as requested. Confirmation received.

## 2024-03-04 NOTE — Addendum Note (Signed)
 Addended by: BLUFORD RAMP D on: 03/04/2024 03:02 PM   Modules accepted: Orders

## 2024-03-04 NOTE — Telephone Encounter (Signed)
" °*  STAT* If patient is at the pharmacy, call can be transferred to refill team.   1. Which medications need to be refilled? (please list name of each medication and dose if known) hydrochlorothiazide  (HYDRODIURIL ) 25 MG tablet (Expired)    2. Would you like to learn more about the convenience, safety, & potential cost savings by using the [facility]?    3. Are you open to using the [facility] (Type [facility].  ).   4. Which pharmacy/location (including street and city if local pharmacy) is medication to be sent to? [id number] - [place], [place] - [address]    5. Do they need a 30 day or 90 day supply? 90 day   "

## 2024-03-04 NOTE — Telephone Encounter (Signed)
 RX sent in.

## 2024-03-08 ENCOUNTER — Encounter: Payer: Self-pay | Admitting: *Deleted

## 2024-03-15 ENCOUNTER — Ambulatory Visit: Admitting: Obstetrics & Gynecology

## 2024-03-15 ENCOUNTER — Encounter: Payer: Self-pay | Admitting: Obstetrics & Gynecology

## 2024-03-15 VITALS — BP 124/70 | HR 64 | Ht 63.0 in | Wt 128.0 lb

## 2024-03-15 DIAGNOSIS — N816 Rectocele: Secondary | ICD-10-CM

## 2024-03-15 DIAGNOSIS — N8110 Cystocele, unspecified: Secondary | ICD-10-CM

## 2024-03-15 DIAGNOSIS — N812 Incomplete uterovaginal prolapse: Secondary | ICD-10-CM

## 2024-03-15 NOTE — Progress Notes (Signed)
 Chief Complaint  Patient presents with   possible bladder prolapse    Blood pressure 124/70, pulse 64, height 5' 3 (1.6 m), weight 128 lb (58.1 kg), last menstrual period 12/27/2010.  Erika Harrison presents today for routine follow up related to her pessary.     ICD-10-CM   1. POP-Q stage 3 cystocele  N81.10     2. POP-Q stage 2 rectocele  N81.6     3. Cystocele with second degree uterine prolapse:  prolapsed bladder holding the uterus up  N81.2     On exam   Fit today for Gelhorn pessary 2 1/2 Harrison, Milex ring #4 and #5 did not provide adequate support Gelhorn 2 3/4 too large  Settled on the 2 1/2, it was comfortable and no urine loss with standing and coughing None in stock had to order it and will see her back to place it when it comes in    ICD-10-CM   1. POP-Q stage 3 cystocele  N81.10     2. POP-Q stage 2 rectocele  N81.6     3. Cystocele with second degree uterine prolapse:  prolapsed bladder holding the uterus up  N81.2        Erika Harrison will be seen back when the pessary comes in  Erika VEAR Inch, MD  03/15/2024 11:37 AM

## 2024-03-25 ENCOUNTER — Ambulatory Visit: Admitting: Adult Health

## 2024-03-25 ENCOUNTER — Encounter: Payer: Self-pay | Admitting: Adult Health

## 2024-03-25 VITALS — BP 109/75 | HR 63 | Ht 63.0 in | Wt 128.0 lb

## 2024-03-25 DIAGNOSIS — N812 Incomplete uterovaginal prolapse: Secondary | ICD-10-CM

## 2024-03-25 DIAGNOSIS — R102 Pelvic and perineal pain unspecified side: Secondary | ICD-10-CM

## 2024-03-25 DIAGNOSIS — N898 Other specified noninflammatory disorders of vagina: Secondary | ICD-10-CM | POA: Diagnosis not present

## 2024-03-25 DIAGNOSIS — N9489 Other specified conditions associated with female genital organs and menstrual cycle: Secondary | ICD-10-CM | POA: Insufficient documentation

## 2024-03-25 LAB — POCT WET PREP (WET MOUNT): Clue Cells Wet Prep Whiff POC: NEGATIVE

## 2024-03-25 NOTE — Progress Notes (Signed)
" °  Subjective:     Patient ID: Erika Harrison, female   DOB: Nov 20, 1955, 69 y.o.   MRN: [id number]  HPI Erika Harrison is a 69 year old white female, married, PM in complaining of vaginal burning and greenish discharge at times, wanted to make sure no infection, supposed to get pessary inserted next week.  PCP is Dr Shona  Review of Systems +vaginal burning  +green discharge at times No itching or odor Reviewed past medical,surgical, social and family history. Reviewed medications and allergies.     Objective:   Physical Exam BP 109/75 (BP Location: Left Arm, Patient Position: Sitting, Cuff Size: Normal)   Pulse 63   Ht 5' 3 (1.6 m)   Wt 128 lb (58.1 kg)   LMP 12/27/2010   BMI 22.67 kg/m     Skin warm and dry.Pelvic: external genitalia is normal in appearance no lesions, vagina: scant  clear discharge without odor,+prolapse and cystocele, urethra has no lesions or masses noted, cervix:smooth and bulbous, uterus: normal size, shape and contour, non tender, no masses felt, adnexa: no masses or tenderness noted. Bladder is non tender and no masses felt. Wet prep:  +WBCs. Fall risk is low  [facility]stream - 03/25/24 1042       Pregnancy Intention Screening   Does the patient want to become pregnant in the next year? N/A    Does the patient's partner want to become pregnant in the next year? N/A    Would the patient like to discuss contraceptive options today? N/A      Contraception Wrap Up   Current Method Post-Menopause    End Method Post-Menopause    Contraception Counseling Provided No         Examination chaperoned by Clarita Salt LPN  Assessment:     1. Vaginal burning (Primary) Has at times, no itching   2. Cystocele with second degree uterine prolapse:  prolapsed bladder holding the uterus up  3. Vaginal discharge Scant to no discharge, no odor or color Had few WBC on slide - POCT Wet Prep [facility])     Plan:     Return in 4 days as scheduled with Dr Jayne for pessary  fitting/insertion of Gelhorn Call before coming since expecting snow the weekend     "

## 2024-03-29 ENCOUNTER — Encounter: Admitting: Obstetrics & Gynecology

## 2024-03-29 ENCOUNTER — Encounter: Payer: Self-pay | Admitting: *Deleted

## 2024-03-30 ENCOUNTER — Ambulatory Visit: Admitting: Obstetrics & Gynecology

## 2024-03-30 VITALS — BP 113/71 | HR 66 | Ht 63.0 in | Wt 128.0 lb

## 2024-03-30 DIAGNOSIS — N816 Rectocele: Secondary | ICD-10-CM

## 2024-03-30 DIAGNOSIS — N8110 Cystocele, unspecified: Secondary | ICD-10-CM | POA: Diagnosis not present

## 2024-04-27 ENCOUNTER — Ambulatory Visit: Admitting: Obstetrics & Gynecology
